# Patient Record
Sex: Female | Born: 1935 | Race: Black or African American | Hispanic: No | Marital: Single | Smoking: Never smoker
Health system: Southern US, Community
[De-identification: ages and names within clinical notes are randomized; demographics above are authoritative.]

## PROBLEM LIST (undated history)

## (undated) DIAGNOSIS — I10 Essential (primary) hypertension: Secondary | ICD-10-CM

## (undated) DIAGNOSIS — M1990 Unspecified osteoarthritis, unspecified site: Secondary | ICD-10-CM

## (undated) DIAGNOSIS — I509 Heart failure, unspecified: Secondary | ICD-10-CM

## (undated) DIAGNOSIS — E7800 Pure hypercholesterolemia, unspecified: Secondary | ICD-10-CM

## (undated) DIAGNOSIS — Z950 Presence of cardiac pacemaker: Secondary | ICD-10-CM

## (undated) HISTORY — PX: APPENDECTOMY: SHX54

## (undated) HISTORY — PX: ABDOMINAL HYSTERECTOMY: SHX81

## (undated) HISTORY — PX: CARDIAC SURGERY: SHX584

## (undated) HISTORY — PX: HIP FRACTURE SURGERY: SHX118

---

## 2020-01-08 ENCOUNTER — Other Ambulatory Visit (HOSPITAL_BASED_OUTPATIENT_CLINIC_OR_DEPARTMENT_OTHER): Payer: Self-pay

## 2020-05-12 ENCOUNTER — Encounter (HOSPITAL_BASED_OUTPATIENT_CLINIC_OR_DEPARTMENT_OTHER): Payer: Self-pay | Admitting: Emergency Medicine

## 2020-05-12 ENCOUNTER — Emergency Department (HOSPITAL_BASED_OUTPATIENT_CLINIC_OR_DEPARTMENT_OTHER): Payer: Medicare Other

## 2020-05-12 ENCOUNTER — Emergency Department (HOSPITAL_BASED_OUTPATIENT_CLINIC_OR_DEPARTMENT_OTHER)
Admission: EM | Admit: 2020-05-12 | Discharge: 2020-05-12 | Disposition: A | Discharge status: Home / Self Care | Payer: Medicare Other | Attending: Emergency Medicine | Admitting: Emergency Medicine

## 2020-05-12 ENCOUNTER — Other Ambulatory Visit: Payer: Self-pay

## 2020-05-12 DIAGNOSIS — T17200A Unspecified foreign body in pharynx causing asphyxiation, initial encounter: Secondary | ICD-10-CM | POA: Insufficient documentation

## 2020-05-12 DIAGNOSIS — Z7982 Long term (current) use of aspirin: Secondary | ICD-10-CM | POA: Insufficient documentation

## 2020-05-12 DIAGNOSIS — Y9289 Other specified places as the place of occurrence of the external cause: Secondary | ICD-10-CM | POA: Insufficient documentation

## 2020-05-12 DIAGNOSIS — Z79899 Other long term (current) drug therapy: Secondary | ICD-10-CM | POA: Diagnosis not present

## 2020-05-12 DIAGNOSIS — W458XXA Other foreign body or object entering through skin, initial encounter: Secondary | ICD-10-CM | POA: Insufficient documentation

## 2020-05-12 DIAGNOSIS — R05 Cough: Secondary | ICD-10-CM | POA: Diagnosis not present

## 2020-05-12 DIAGNOSIS — I509 Heart failure, unspecified: Secondary | ICD-10-CM | POA: Diagnosis not present

## 2020-05-12 DIAGNOSIS — I110 Hypertensive heart disease with heart failure: Secondary | ICD-10-CM | POA: Diagnosis not present

## 2020-05-12 DIAGNOSIS — R0989 Other specified symptoms and signs involving the circulatory and respiratory systems: Secondary | ICD-10-CM

## 2020-05-12 DIAGNOSIS — Y999 Unspecified external cause status: Secondary | ICD-10-CM | POA: Diagnosis not present

## 2020-05-12 DIAGNOSIS — Y9389 Activity, other specified: Secondary | ICD-10-CM | POA: Diagnosis not present

## 2020-05-12 HISTORY — DX: Presence of cardiac pacemaker: Z95.0

## 2020-05-12 HISTORY — DX: Heart failure, unspecified: I50.9

## 2020-05-12 HISTORY — DX: Essential (primary) hypertension: I10

## 2020-05-12 HISTORY — DX: Unspecified osteoarthritis, unspecified site: M19.90

## 2020-05-12 HISTORY — DX: Pure hypercholesterolemia, unspecified: E78.00

## 2020-05-12 MED ORDER — LIDOCAINE VISCOUS HCL 2 % MT SOLN
15.0000 mL | Freq: Once | OROMUCOSAL | Status: AC
  Administered 2020-05-12: 15 mL via OROMUCOSAL
  Filled 2020-05-12: qty 15

## 2020-05-12 NOTE — ED Provider Notes (Addendum)
[facility] EMERGENCY DEPARTMENT Provider Note   CSN: [id number] Arrival date & time: 05/12/20  1237     History Chief Complaint  Patient presents with  . Foreign Body    throat    Hartley Wyke is a 86 y.o. female.  86 yo F with a cc of possible pill aspiration.  The patient was taking her morning medications and she felt like a pill got stuck and she had a coughing fit for some time.  Still has a sensation that there is something stuck in there.  Has been able to eat and drink afterwards.  She is unsure which pill it was that she was taking.  Typically takes 2 at a time.  The history is provided by the patient.  Foreign Body Associated symptoms: cough and trouble swallowing   Associated symptoms: no congestion, no nausea, no rhinorrhea and no vomiting   Illness Severity:  Moderate Onset quality:  Gradual Duration:  2 hours Timing:  Rare Progression:  Resolved Chronicity:  New Associated symptoms: cough   Associated symptoms: no chest pain, no congestion, no fever, no headaches, no myalgias, no nausea, no rhinorrhea, no shortness of breath, no vomiting and no wheezing        Past Medical History:  Diagnosis Date  . Arthritis   . CHF (congestive heart failure) (HCC)   . High cholesterol   . Hypertension   . Pacemaker     There are no problems to display for this patient.   Past Surgical History:  Procedure Laterality Date  . ABDOMINAL HYSTERECTOMY    . APPENDECTOMY    . CARDIAC SURGERY       OB History   No obstetric history on file.     No family history on file.  Social History   Tobacco Use  . Smoking status: Never Smoker  . Smokeless tobacco: Never Used  Substance Use Topics  . Alcohol use: Not Currently  . Drug use: Not on file    Home Medications Prior to Admission medications   Medication Sig Start Date End Date Taking? Authorizing Provider  acetaminophen-codeine (TYLENOL #3) 300-30 MG tablet Take 1 tablet by mouth every 4  (four) hours as needed for moderate pain. Takes 2 times a day   Yes [provider]  aspirin 325 MG tablet Take 325 mg by mouth daily.   Yes [provider]  dextromethorphan-guaiFENesin (MUCINEX DM) 30-600 MG 12hr tablet Take 1 tablet by mouth 2 (two) times daily.   Yes [provider]  diltiazem (CARDIZEM) 120 MG tablet Take by mouth at bedtime. 03/26/20   [provider]  furosemide (LASIX) 40 MG tablet Take 40 mg by mouth daily. 03/31/20   [provider]  isosorbide mononitrate (ISMO) 20 MG tablet Take 20 mg by mouth 2 (two) times daily. 04/29/20   [provider]  potassium chloride SA (KLOR-CON) 20 MEQ tablet Take 20 mEq by mouth daily. 04/29/20   [provider]  rosuvastatin (CRESTOR) 10 MG tablet Take 10 mg by mouth daily. 04/29/20   [provider]    Allergies    Codeine  Review of Systems   Review of Systems  Constitutional: Negative for chills and fever.  HENT: Positive for trouble swallowing. Negative for congestion and rhinorrhea.   Eyes: Negative for redness and visual disturbance.  Respiratory: Positive for cough. Negative for shortness of breath and wheezing.   Cardiovascular: Negative for chest pain and palpitations.  Gastrointestinal: Negative for nausea and  vomiting.  Genitourinary: Negative for dysuria and urgency.  Musculoskeletal: Negative for arthralgias and myalgias.  Skin: Negative for pallor and wound.  Neurological: Negative for dizziness and headaches.    Physical Exam Updated Vital Signs BP (!) 140/56   Pulse (!) 58   Temp 98.2 F (36.8 C) (Oral)   Resp 16   Ht 4\' 11"  (1.499 m)   Wt 49 kg   SpO2 98%   BMI 21.81 kg/m   Physical Exam Vitals and nursing note reviewed.  Constitutional:      General: She is not in acute distress.    Appearance: She is well-developed. She is not diaphoretic.  HENT:     Head: Normocephalic and atraumatic.     Comments: No erythema to the posterior  oropharynx no tonsillar swelling.  Tolerating secretions out difficulty. Eyes:     Pupils: Pupils are equal, round, and reactive to light.  Cardiovascular:     Rate and Rhythm: Normal rate and regular rhythm.     Heart sounds: No murmur heard.  No friction rub. No gallop.   Pulmonary:     Effort: Pulmonary effort is normal.     Breath sounds: No wheezing or rales.  Abdominal:     General: There is no distension.     Palpations: Abdomen is soft.     Tenderness: There is no abdominal tenderness.  Musculoskeletal:        General: No tenderness.     Cervical back: Normal range of motion and neck supple.  Skin:    General: Skin is warm and dry.  Neurological:     Mental Status: She is alert and oriented to person, place, and time.  Psychiatric:        Behavior: Behavior normal.     ED Results / Procedures / Treatments   Labs (all labs ordered are listed, but only abnormal results are displayed) Labs Reviewed - No data to display  EKG None  Radiology DG [facility] 1 View  Result Date: 05/12/2020 CLINICAL DATA:  Pill aspiration.  Lingering midsternal chest pain EXAM: PORTABLE CHEST 1 VIEW COMPARISON:  None FINDINGS: LEFT-sided dual lead pacer device power pack over LEFT chest. Cardiac enlargement with tortuous and calcified thoracic aorta. Lungs are clear. LEFT chest partially obscured by power pack for pacer device. No unexpected radiopaque foreign body. No acute skeletal process on limited assessment. Marked glenohumeral degenerative changes on the RIGHT. IMPRESSION: 1. No acute cardiopulmonary disease. Electronically Signed   By: 07/12/2020 M.D.   On: 05/12/2020 14:35    Procedures Procedures (including critical care time)  Medications Ordered in ED Medications  lidocaine (XYLOCAINE) 2 % viscous mouth solution 15 mL (15 mLs Mouth/Throat Given 05/12/20 1437)    ED Course  I have reviewed the triage vital signs and the nursing notes.  Pertinent labs & imaging results  that were available during my care of the patient were reviewed by me and considered in my medical decision making (see chart for details).    MDM Rules/Calculators/A&P                          86 yo F with a chief complaints of a possible aspiration of the pill.  Able to swallow afterwards.  Will obtain a plain film of the chest.  Viscous lidocaine.  Able to swallow without difficulty.  Chest x-ray reviewed by me without focal infiltrate no aspiration.  Discharge home.  3:31 PM:  I have discussed the diagnosis/risks/treatment options with the patient and family and believe the pt to be eligible for discharge home to follow-up with PCP. We also discussed returning to the ED immediately if new or worsening sx occur. We discussed the sx which are most concerning (e.g., sudden worsening pain, fever, inability to tolerate by mouth) that necessitate immediate return. Medications administered to the patient during their visit and any new prescriptions provided to the patient are listed below.  Medications given during this visit Medications  lidocaine (XYLOCAINE) 2 % viscous mouth solution 15 mL (15 mLs Mouth/Throat Given 05/12/20 1437)     The patient appears reasonably screen and/or stabilized for discharge and I doubt any other medical condition or other [facility] requiring further screening, evaluation, or treatment in the ED at this time prior to discharge.   Final Clinical Impression(s) / ED Diagnoses Final diagnoses:  Foreign body sensation in throat    Rx / DC Orders ED Discharge Orders    None       Melene Plan, DO 05/12/20 1531    Melene Plan, DO 05/12/20 1531

## 2020-05-12 NOTE — ED Triage Notes (Signed)
States she was taking her medication this morning and she got choked on one of the pills. She still feels like it is stuck in her throat. No distress noted, pt able to handle secretions.

## 2020-05-12 NOTE — Discharge Instructions (Signed)
Please return for inability to swallow, worsening cough or trouble breathing

## 2020-05-12 NOTE — ED Notes (Signed)
ED Provider at bedside. 

## 2020-07-02 ENCOUNTER — Other Ambulatory Visit (HOSPITAL_BASED_OUTPATIENT_CLINIC_OR_DEPARTMENT_OTHER): Payer: Self-pay | Admitting: Family Medicine

## 2020-07-08 ENCOUNTER — Emergency Department (HOSPITAL_BASED_OUTPATIENT_CLINIC_OR_DEPARTMENT_OTHER): Payer: Medicare Other

## 2020-07-08 ENCOUNTER — Emergency Department (HOSPITAL_BASED_OUTPATIENT_CLINIC_OR_DEPARTMENT_OTHER)
Admission: EM | Admit: 2020-07-08 | Discharge: 2020-07-08 | Disposition: A | Discharge status: Home / Self Care | Payer: Medicare Other | Attending: Emergency Medicine | Admitting: Emergency Medicine

## 2020-07-08 ENCOUNTER — Encounter (HOSPITAL_BASED_OUTPATIENT_CLINIC_OR_DEPARTMENT_OTHER): Payer: Self-pay

## 2020-07-08 ENCOUNTER — Other Ambulatory Visit: Payer: Self-pay

## 2020-07-08 ENCOUNTER — Other Ambulatory Visit (HOSPITAL_BASED_OUTPATIENT_CLINIC_OR_DEPARTMENT_OTHER): Payer: Self-pay | Admitting: Emergency Medicine

## 2020-07-08 DIAGNOSIS — R053 Chronic cough: Secondary | ICD-10-CM

## 2020-07-08 DIAGNOSIS — Y92002 Bathroom of unspecified non-institutional (private) residence single-family (private) house as the place of occurrence of the external cause: Secondary | ICD-10-CM | POA: Insufficient documentation

## 2020-07-08 DIAGNOSIS — I110 Hypertensive heart disease with heart failure: Secondary | ICD-10-CM | POA: Diagnosis not present

## 2020-07-08 DIAGNOSIS — W19XXXA Unspecified fall, initial encounter: Secondary | ICD-10-CM

## 2020-07-08 DIAGNOSIS — Z7982 Long term (current) use of aspirin: Secondary | ICD-10-CM | POA: Diagnosis not present

## 2020-07-08 DIAGNOSIS — Z79899 Other long term (current) drug therapy: Secondary | ICD-10-CM | POA: Insufficient documentation

## 2020-07-08 DIAGNOSIS — M25551 Pain in right hip: Secondary | ICD-10-CM

## 2020-07-08 DIAGNOSIS — I509 Heart failure, unspecified: Secondary | ICD-10-CM | POA: Insufficient documentation

## 2020-07-08 DIAGNOSIS — Z950 Presence of cardiac pacemaker: Secondary | ICD-10-CM | POA: Insufficient documentation

## 2020-07-08 DIAGNOSIS — W1811XA Fall from or off toilet without subsequent striking against object, initial encounter: Secondary | ICD-10-CM | POA: Diagnosis not present

## 2020-07-08 MED ORDER — DOXYCYCLINE HYCLATE 100 MG PO CAPS: 100.0000 mg | ORAL_CAPSULE | Freq: Two times a day (BID) | ORAL | 0 refills | Status: DC

## 2020-07-08 MED FILL — DOXYCYCLINE HYCLATE 100 MG: 100 | 10 days supply | Qty: 20 | Fill #0

## 2020-07-08 MED FILL — OMEPRAZOLE 40 MG CPDR: 40 | 90 days supply | Qty: 90 | Fill #0

## 2020-07-08 NOTE — ED Notes (Signed)
Pt fell off commode earlier in week, per family only told anyone about it today, has been able to ambulate but with visible discomfort.  Pt changed into hospital gown for exam.  No visible bruising noted, strong pedal pulses bilat.

## 2020-07-08 NOTE — Discharge Instructions (Addendum)
You were seen in the emergency department for evaluation of right hip pain after a fall.  You also were having worsening of a chronic cough.  You had x-rays and a CAT scan which showed a lot of arthritis in your back but no obvious fractures.  There was also no obvious pneumonia.  Tylenol for pain weightbearing as tolerated.  We also starting you on a course of antibiotics for your cough.  Please schedule follow-up appointment with your primary care doctor.  Return to the emergency department if any worsening or concerning symptoms

## 2020-07-08 NOTE — ED Notes (Signed)
Patient transported to CT 

## 2020-07-08 NOTE — ED Provider Notes (Signed)
[facility] EMERGENCY DEPARTMENT Provider Note   CSN: [id number] Arrival date & time: 07/08/20  9628     History No chief complaint on file.   Mackayla Mullins is a 86 y.o. female.  She is brought in by her daughter for evaluation of right hip pain.  The patient states she fell about a week and a half ago off of the commode but did not tell anybody about her symptoms until today.  Pain is worse with movement of the leg and walking.  No numbness.  Denies any back pain.  Sometimes the pain runs down the leg though.  Daughter states she has had a chronic cough and is seeing the pulmonologist but is still coughing.  They cannot seem to find a cause for it.  No fevers denies shortness of breath.  No chest pain.  Has been Covid vaccinated.  No head or neck pain.  The history is provided by the patient and a relative.  Hip Pain This is a new problem. The current episode started more than 1 week ago. The problem occurs constantly. The problem has not changed since onset.Pertinent negatives include no chest pain, no abdominal pain, no headaches and no shortness of breath. The symptoms are aggravated by walking and bending. Nothing relieves the symptoms. She has tried rest for the symptoms. The treatment provided no relief.       Past Medical History:  Diagnosis Date  . Arthritis   . CHF (congestive heart failure) (HCC)   . High cholesterol   . Hypertension   . Pacemaker     There are no problems to display for this patient.   Past Surgical History:  Procedure Laterality Date  . ABDOMINAL HYSTERECTOMY    . APPENDECTOMY    . CARDIAC SURGERY       OB History   No obstetric history on file.     History reviewed. No pertinent family history.  Social History   Tobacco Use  . Smoking status: Never Smoker  . Smokeless tobacco: Never Used  Substance Use Topics  . Alcohol use: Not Currently  . Drug use: Not on file    Home Medications Prior to Admission medications     Medication Sig Start Date End Date Taking? Authorizing Provider  acetaminophen-codeine (TYLENOL #3) 300-30 MG tablet Take 1 tablet by mouth every 4 (four) hours as needed for moderate pain. Takes 2 times a day    [provider]  aspirin 325 MG tablet Take 325 mg by mouth daily.    [provider]  dextromethorphan-guaiFENesin (MUCINEX DM) 30-600 MG 12hr tablet Take 1 tablet by mouth 2 (two) times daily.    [provider]  diltiazem (CARDIZEM) 120 MG tablet Take by mouth at bedtime. 03/26/20   [provider]  furosemide (LASIX) 40 MG tablet Take 40 mg by mouth daily. 03/31/20   [provider]  isosorbide mononitrate (ISMO) 20 MG tablet Take 20 mg by mouth 2 (two) times daily. 04/29/20   [provider]  potassium chloride SA (KLOR-CON) 20 MEQ tablet Take 20 mEq by mouth daily. 04/29/20   [provider]  rosuvastatin (CRESTOR) 10 MG tablet Take 10 mg by mouth daily. 04/29/20   [provider]    Allergies    Codeine  Review of Systems   Review of Systems  Constitutional: Negative for fever.  HENT: Negative for sore throat.   Eyes: Negative for pain.  Respiratory: Positive for cough. Negative for shortness of  breath.   Cardiovascular: Negative for chest pain.  Gastrointestinal: Negative for abdominal pain.  Genitourinary: Negative for dysuria.  Musculoskeletal: Positive for gait problem. Negative for back pain.  Skin: Negative for rash.  Neurological: Negative for headaches.    Physical Exam Updated Vital Signs BP (!) 152/61 (BP Location: Right Arm)   Pulse 61   Temp 98.6 F (37 C) (Oral)   Resp 16   Ht 4\' 11"  (1.499 m)   Wt 50.8 kg   SpO2 98%   BMI 22.62 kg/m   Physical Exam Vitals and nursing note reviewed.  Constitutional:      General: She is not in acute distress.    Appearance: Normal appearance. She is well-developed.  HENT:     Head: Normocephalic and atraumatic.  Eyes:      Conjunctiva/sclera: Conjunctivae normal.  Cardiovascular:     Rate and Rhythm: Normal rate and regular rhythm.     Heart sounds: No murmur heard.   Pulmonary:     Effort: Pulmonary effort is normal. No respiratory distress.     Breath sounds: Normal breath sounds.  Abdominal:     Palpations: Abdomen is soft.     Tenderness: There is no abdominal tenderness.  Musculoskeletal:        General: Tenderness present.     Cervical back: Neck supple.     Comments: She has diffuse tenderness around her right hip and buttock.  There is no obvious shortening or rotation.  Knee and ankle nontender.  Other extremities full range of motion without any pain or limitations.  Skin:    General: Skin is warm and dry.     Capillary Refill: Capillary refill takes less than 2 seconds.  Neurological:     General: No focal deficit present.     Mental Status: She is alert.     Sensory: No sensory deficit.     Motor: No weakness.     ED Results / Procedures / Treatments   Labs (all labs ordered are listed, but only abnormal results are displayed) Labs Reviewed - No data to display  EKG None  Radiology DG Chest 2 View  Result Date: 07/08/2020 CLINICAL DATA:  Right hip pain fall. EXAM: CHEST - 2 VIEW COMPARISON:  One-view chest x-ray 05/12/2020 FINDINGS: Heart size is mildly enlarged. Atherosclerotic changes are present in the aorta. No edema or effusion is present. Pacing wires are in place. Advanced degenerative changes are present shoulder. Scoliosis is evident. IMPRESSION: 1. Mild cardiomegaly without failure. 2. No acute cardiopulmonary disease. 3. Atherosclerosis. Electronically Signed   By: 07/12/2020 M.D.   On: 07/08/2020 10:39   DG Lumbar Spine Complete  Result Date: 07/08/2020 CLINICAL DATA:  Back pain after fall EXAM: LUMBAR SPINE - COMPLETE 4+ VIEW COMPARISON:  None. FINDINGS: Diffuse osseous demineralization. There is lumbar dextrocurvature centered at L3-4 with chronic appearing  asymmetric wedging of the L3 and L4 vertebral bodies. Severe multilevel intervertebral disc height loss. Straightening of the lumbar lordosis without static listhesis. Advanced abdominal aortic atherosclerosis. IMPRESSION: 1. Diffuse osseous demineralization with chronic appearing asymmetric wedging of the L3 and L4 vertebral bodies likely compensatory to lumbar dextrocurvature. Difficult to exclude acute on chronic fracture. 2. Severe multilevel degenerative disc disease. Electronically Signed   By: 13/03/2020 D.O.   On: 07/08/2020 10:43   CT Lumbar Spine Wo Contrast  Result Date: 07/08/2020 CLINICAL DATA:  13/03/2020. Back pain. EXAM: CT LUMBAR SPINE WITHOUT CONTRAST TECHNIQUE: Multidetector CT imaging of the lumbar spine  was performed without intravenous contrast administration. Multiplanar CT image reconstructions were also generated. COMPARISON:  None. FINDINGS: Segmentation: There are five lumbar type vertebral bodies. The last full intervertebral disc space is labeled L5-S1. Alignment: Severe scoliosis and associated advanced degenerative lumbar spondylosis with multilevel disc disease and facet disease. Vertebrae: No acute lumbar spine fracture is identified. The facets are maintained. No pars defects or fractures. No upper sacral fractures are identified. Paraspinal and other soft tissues: No significant paraspinal or retroperitoneal findings other than severe/advanced atherosclerotic calcifications involving the aorta and branch vessels. No retroperitoneal hematoma. Disc levels: Advanced degenerative lumbar spondylosis with significant multilevel disc disease and facet disease. Multilevel spinal and foraminal stenosis. IMPRESSION: 1. Severe scoliosis and associated advanced degenerative lumbar spondylosis with multilevel disc disease and facet disease. 2. No acute lumbar spine fracture. 3. Multilevel spinal and foraminal stenosis. 4. Aortic atherosclerosis. Aortic Atherosclerosis (ICD10-I70.0).  Electronically Signed   By: Rudie MeyerP.  Gallerani M.D.   On: 07/08/2020 12:32   CT Hip Right Wo Contrast  Result Date: 07/08/2020 CLINICAL DATA:  Hip pain, recent fall EXAM: CT OF THE RIGHT HIP WITHOUT CONTRAST TECHNIQUE: Multidetector CT imaging of the right hip was performed according to the standard protocol. Multiplanar CT image reconstructions were also generated. COMPARISON:  X-ray 07/08/2020 FINDINGS: Bones/Joint/Cartilage No acute fracture or dislocation of the right hip. Mild hip joint space narrowing. No appreciable hip joint effusion. Visualized portion of the right hemipelvis is intact. Pubic symphysis and visualized right SI joint intact without diastasis. No lytic or sclerotic bone lesions are identified. The bones are demineralized. Ligaments Suboptimally assessed by CT. Muscles and Tendons No acute musculotendinous abnormality by CT. Soft tissues No soft tissue swelling or focal hematoma. Atherosclerotic calcifications are present. IMPRESSION: No acute osseous abnormality of the right hip. Electronically Signed   By: Duanne GuessNicholas  Plundo D.O.   On: 07/08/2020 12:26   DG Hip Unilat With Pelvis 2-3 Views Right  Result Date: 07/08/2020 CLINICAL DATA:  Larey SeatFell. Right hip pain. EXAM: DG HIP (WITH OR WITHOUT PELVIS) 2-3V RIGHT COMPARISON:  None. FINDINGS: Both hips are normally located. Mild bilateral hip joint degenerative changes for age. No acute hip fracture is identified. The pubic symphysis and SI joints are intact. No pelvic fractures or bone lesions. Age related vascular calcifications. Degenerative changes noted in the lower lumbar spine. IMPRESSION: Mild degenerative changes but no acute bony findings. Electronically Signed   By: Rudie MeyerP.  Gallerani M.D.   On: 07/08/2020 10:43    Procedures Procedures (including critical care time)  Medications Ordered in ED Medications - No data to display  ED Course  I have reviewed the triage vital signs and the nursing notes.  Pertinent labs & imaging results  that were available during my care of the patient were reviewed by me and considered in my medical decision making (see chart for details).  Clinical Course as of Jul 08 1725  Mon Jul 08, 2020  1239 Reviewed results of imaging with patient and her daughter.  Daughter is asking if we can put her on some antibiotics for her cough.  Will treat empirically for bronchitis.   [MB]    Clinical Course User Index [MB] Terrilee FilesButler, Arriona Prest C, MD   MDM Rules/Calculators/A&P                         86 year old female brought in by her daughter for evaluation of injuries from a fall over a week ago.  Primarily tender around her left  hip and buttock.  Ordered and interpreted imaging of plain films of lumbosacral spine hip and chest as degenerative changes scoliosis no acute infiltrates.  Due to her continued hip pain I ordered CAT scans of her lumbar spine and right hip which do not show any other occult fractures but significant degenerative changes.  I reviewed this with the patient and her daughter.  She is comfortable with plan for mild pain control and following up with her doctor.  She does ask if we can put her on antibiotics for her cough which I have done.  Return instructions discussed  Final Clinical Impression(s) / ED Diagnoses Final diagnoses:  Fall, initial encounter  Right hip pain  Chronic cough    Rx / DC Orders ED Discharge Orders         Ordered    doxycycline (VIBRAMYCIN) 100 MG capsule  2 times daily        07/08/20 1240           Terrilee Files, MD 07/08/20 1728

## 2020-07-08 NOTE — ED Triage Notes (Signed)
Per pt fell from commode, I have R hip/leg/abd pain since fall.

## 2020-08-09 ENCOUNTER — Ambulatory Visit: Payer: Medicare Other | Attending: Internal Medicine

## 2020-08-09 ENCOUNTER — Other Ambulatory Visit (HOSPITAL_BASED_OUTPATIENT_CLINIC_OR_DEPARTMENT_OTHER): Payer: Self-pay | Admitting: Internal Medicine

## 2020-08-09 DIAGNOSIS — Z23 Encounter for immunization: Secondary | ICD-10-CM

## 2020-08-09 NOTE — Progress Notes (Signed)
   Covid-19 Vaccination Clinic  Name:  Cassandra Patel    MRN: [id number] DOB: March 06, 1922  08/09/2020  [name] was observed post Covid-19 immunization for 15 minutes without incident. She was provided with Vaccine Information Sheet and instruction to access the V-Safe system.   [name] was instructed to call 911 with any severe reactions post vaccine: Marland Kitchen Difficulty breathing  . Swelling of face and throat  . A fast heartbeat  . A bad rash all over body  . Dizziness and weakness   Immunizations Administered    Name Date Dose VIS Date Route   Pfizer COVID-19 Vaccine 08/09/2020 10:37 AM 0.3 mL 06/19/2020 Intramuscular   Manufacturer: [employer], [employer]   Lot: [id number]   NDC: [address]-2

## 2020-08-13 MED FILL — PFIZER-BIONTECH COVID-19 VA: 30 | 1 days supply | Qty: 0 | Fill #0

## 2020-08-30 ENCOUNTER — Emergency Department (HOSPITAL_BASED_OUTPATIENT_CLINIC_OR_DEPARTMENT_OTHER)
Admission: EM | Admit: 2020-08-30 | Discharge: 2020-08-30 | Disposition: A | Discharge status: Home / Self Care | Payer: Medicare Other | Attending: Emergency Medicine | Admitting: Emergency Medicine

## 2020-08-30 ENCOUNTER — Other Ambulatory Visit (HOSPITAL_BASED_OUTPATIENT_CLINIC_OR_DEPARTMENT_OTHER): Payer: Self-pay | Admitting: Emergency Medicine

## 2020-08-30 ENCOUNTER — Encounter (HOSPITAL_BASED_OUTPATIENT_CLINIC_OR_DEPARTMENT_OTHER): Payer: Self-pay | Admitting: *Deleted

## 2020-08-30 ENCOUNTER — Other Ambulatory Visit: Payer: Self-pay

## 2020-08-30 ENCOUNTER — Emergency Department (HOSPITAL_BASED_OUTPATIENT_CLINIC_OR_DEPARTMENT_OTHER): Payer: Medicare Other

## 2020-08-30 DIAGNOSIS — Z79899 Other long term (current) drug therapy: Secondary | ICD-10-CM | POA: Diagnosis not present

## 2020-08-30 DIAGNOSIS — I509 Heart failure, unspecified: Secondary | ICD-10-CM | POA: Diagnosis not present

## 2020-08-30 DIAGNOSIS — I110 Hypertensive heart disease with heart failure: Secondary | ICD-10-CM | POA: Diagnosis not present

## 2020-08-30 DIAGNOSIS — Z20822 Contact with and (suspected) exposure to covid-19: Secondary | ICD-10-CM | POA: Diagnosis not present

## 2020-08-30 DIAGNOSIS — R059 Cough, unspecified: Secondary | ICD-10-CM | POA: Insufficient documentation

## 2020-08-30 DIAGNOSIS — Z950 Presence of cardiac pacemaker: Secondary | ICD-10-CM | POA: Diagnosis not present

## 2020-08-30 DIAGNOSIS — R0989 Other specified symptoms and signs involving the circulatory and respiratory systems: Secondary | ICD-10-CM | POA: Insufficient documentation

## 2020-08-30 DIAGNOSIS — Z7982 Long term (current) use of aspirin: Secondary | ICD-10-CM | POA: Insufficient documentation

## 2020-08-30 LAB — RESP PANEL BY RT-PCR (FLU A&B, COVID) ARPGX2
Influenza A by PCR: NEGATIVE
Influenza B by PCR: NEGATIVE
SARS Coronavirus 2 by RT PCR: NEGATIVE

## 2020-08-30 MED ORDER — CEFTRIAXONE SODIUM 1 G IJ SOLR
1.0000 g | Freq: Once | INTRAMUSCULAR | Status: AC
  Administered 2020-08-30: 1 g via INTRAMUSCULAR
  Filled 2020-08-30: qty 10

## 2020-08-30 MED ORDER — DOXYCYCLINE HYCLATE 100 MG PO CAPS: 100.0000 mg | ORAL_CAPSULE | Freq: Two times a day (BID) | ORAL | 0 refills | Status: DC

## 2020-08-30 MED FILL — DOXYCYCLINE HYCLATE 100 MG: 100 | 7 days supply | Qty: 14 | Fill #0

## 2020-08-30 NOTE — ED Provider Notes (Signed)
[facility] EMERGENCY DEPARTMENT Provider Note   CSN: [id number] Arrival date & time: 08/30/20  3329     History Chief Complaint  Patient presents with  . Cough    Rhona Fusilier is a 86 y.o. female.  Patient presents to ER chief complaint of cough and congestion.  He was recently on a cruise and returned home 5 days ago.  She was tested before and during the cruise and was Covid negative.  However she is had symptoms now of cough and congestion for 2 days since returning.  She tried to see her primary care doctor but was told to get a Covid test before she was able to see her doctor.  Otherwise denies any fevers or pain.  Denies shortness of breath.  Denies vomiting or diarrhea or loss of smell or taste.        Past Medical History:  Diagnosis Date  . Arthritis   . CHF (congestive heart failure) (HCC)   . High cholesterol   . Hypertension   . Pacemaker     There are no problems to display for this patient.   Past Surgical History:  Procedure Laterality Date  . ABDOMINAL HYSTERECTOMY    . APPENDECTOMY    . CARDIAC SURGERY       OB History   No obstetric history on file.     History reviewed. No pertinent family history.  Social History   Tobacco Use  . Smoking status: Never Smoker  . Smokeless tobacco: Never Used  Substance Use Topics  . Alcohol use: Not Currently    Home Medications Prior to Admission medications   Medication Sig Start Date End Date Taking? Authorizing Provider  acetaminophen-codeine (TYLENOL #3) 300-30 MG tablet Take 1 tablet by mouth every 4 (four) hours as needed for moderate pain. Takes 2 times a day   Yes [provider]  aspirin 325 MG tablet Take 325 mg by mouth daily.   Yes [provider]  dextromethorphan-guaiFENesin (MUCINEX DM) 30-600 MG 12hr tablet Take 1 tablet by mouth 2 (two) times daily.   Yes [provider]  diltiazem (CARDIZEM) 120 MG tablet Take by mouth at bedtime. 03/26/20  Yes  [provider]  furosemide (LASIX) 40 MG tablet Take 40 mg by mouth daily. 03/31/20  Yes [provider]  isosorbide mononitrate (ISMO) 20 MG tablet Take 20 mg by mouth 2 (two) times daily. 04/29/20  Yes [provider]  potassium chloride SA (KLOR-CON) 20 MEQ tablet Take 20 mEq by mouth daily. 04/29/20  Yes [provider]  rosuvastatin (CRESTOR) 10 MG tablet Take 10 mg by mouth daily. 04/29/20  Yes [provider]  doxycycline (VIBRAMYCIN) 100 MG capsule Take 1 capsule (100 mg total) by mouth 2 (two) times daily. 07/08/20   Terrilee Files, MD    Allergies    Codeine  Review of Systems   Review of Systems  Constitutional: Negative for fever.  HENT: Negative for ear pain.   Eyes: Negative for pain.  Respiratory: Positive for cough.   Cardiovascular: Negative for chest pain.  Gastrointestinal: Negative for abdominal pain.  Genitourinary: Negative for flank pain.  Musculoskeletal: Negative for back pain.  Skin: Negative for rash.  Neurological: Negative for headaches.    Physical Exam Updated Vital Signs BP (!) 155/51 (BP Location: Right Arm)   Pulse 68   Temp 98.5 F (36.9 C) (Oral)   Resp 16   Ht 4\' 11"  (1.499 m)   Wt 48.1  kg   SpO2 98%   BMI 21.41 kg/m   Physical Exam Constitutional:      General: She is not in acute distress.    Appearance: Normal appearance.  HENT:     Head: Normocephalic.     Nose: Nose normal.  Eyes:     Extraocular Movements: Extraocular movements intact.  Cardiovascular:     Rate and Rhythm: Normal rate.  Pulmonary:     Effort: Pulmonary effort is normal.  Musculoskeletal:        General: Normal range of motion.     Cervical back: Normal range of motion.  Neurological:     General: No focal deficit present.     Mental Status: She is alert. Mental status is at baseline.     ED Results / Procedures / Treatments   Labs (all labs ordered are listed, but only abnormal results are  displayed) Labs Reviewed  RESP PANEL BY RT-PCR (FLU A&B, COVID) [id number]    EKG None  Radiology DG Chest Port 1 View  Result Date: 08/30/2020 CLINICAL DATA:  Cough EXAM: PORTABLE CHEST 1 VIEW COMPARISON:  07/08/2020 FINDINGS: Left pacer remains in place, unchanged. Heart is normal size. Right infrahilar and left suprahilar airspace opacities could reflect pneumonia. No effusions or acute bony abnormality. IMPRESSION: Patchy right infrahilar and left suprahilar opacities concerning for pneumonia. Electronically Signed   By: Charlett Nose M.D.   On: 08/30/2020 13:42    Procedures Procedures (including critical care time)  Medications Ordered in ED Medications  cefTRIAXone (ROCEPHIN) injection 1 g (has no administration in time range)    ED Course  I have reviewed the triage vital signs and the nursing notes.  Pertinent labs & imaging results that were available during my care of the patient were reviewed by me and considered in my medical decision making (see chart for details).    MDM Rules/Calculators/A&P                          X-ray shows hazy opacities concerning for possible pneumonia per radiology.  Covid test sent and is negative.  Patient given Rocephin here in the ER, given prescription of doxycycline to go home with.  Advised follow-up with her primary care doctor in 2 or 3 days.  Advised immediate return for difficulty breathing fevers worsening cough or any additional concerns.   Final Clinical Impression(s) / ED Diagnoses Final diagnoses:  Cough    Rx / DC Orders ED Discharge Orders    None       Cheryll Cockayne, MD 08/30/20 1610

## 2020-08-30 NOTE — ED Triage Notes (Signed)
Pt reports increase cough since taking a cruise 2 weeks ago, denies fevers, sore throat, or any other c/o. Pt daughter reports pt has had cough "for years" and her pulmonologist tells them her lungs "are fine". Pt is a/a in nad.

## 2020-08-30 NOTE — ED Notes (Signed)
Pt her with complaints of chronic cough but got worse yesterday. Had been on a cruise 5 days ago.  No SOB, no Diarrhea or vomiting

## 2020-08-30 NOTE — Discharge Instructions (Addendum)
Call your primary care doctor or specialist as discussed in the next 2-3 days.   Return immediately back to the ER if:  Your symptoms worsen within the next 12-24 hours. You develop new symptoms such as new fevers, persistent vomiting, new pain, shortness of breath, or new weakness or numbness, or if you have any other concerns.  

## 2020-08-30 NOTE — ED Notes (Signed)
Called for in lobby by [employer], no answer

## 2020-09-04 ENCOUNTER — Emergency Department (HOSPITAL_COMMUNITY): Payer: Medicare Other

## 2020-09-04 ENCOUNTER — Encounter (HOSPITAL_COMMUNITY): Payer: Self-pay | Admitting: Emergency Medicine

## 2020-09-04 ENCOUNTER — Emergency Department (HOSPITAL_COMMUNITY)
Admission: EM | Admit: 2020-09-04 | Discharge: 2020-09-05 | Disposition: A | Discharge status: Home / Self Care | Payer: Medicare Other | Attending: Emergency Medicine | Admitting: Emergency Medicine

## 2020-09-04 DIAGNOSIS — I509 Heart failure, unspecified: Secondary | ICD-10-CM | POA: Diagnosis not present

## 2020-09-04 DIAGNOSIS — M25511 Pain in right shoulder: Secondary | ICD-10-CM | POA: Insufficient documentation

## 2020-09-04 DIAGNOSIS — I110 Hypertensive heart disease with heart failure: Secondary | ICD-10-CM | POA: Insufficient documentation

## 2020-09-04 DIAGNOSIS — M25551 Pain in right hip: Secondary | ICD-10-CM | POA: Diagnosis not present

## 2020-09-04 DIAGNOSIS — Z950 Presence of cardiac pacemaker: Secondary | ICD-10-CM | POA: Diagnosis not present

## 2020-09-04 DIAGNOSIS — Y92003 Bedroom of unspecified non-institutional (private) residence as the place of occurrence of the external cause: Secondary | ICD-10-CM | POA: Diagnosis not present

## 2020-09-04 DIAGNOSIS — W06XXXA Fall from bed, initial encounter: Secondary | ICD-10-CM | POA: Insufficient documentation

## 2020-09-04 DIAGNOSIS — Z79899 Other long term (current) drug therapy: Secondary | ICD-10-CM | POA: Diagnosis not present

## 2020-09-04 DIAGNOSIS — Z7982 Long term (current) use of aspirin: Secondary | ICD-10-CM | POA: Insufficient documentation

## 2020-09-04 NOTE — ED Triage Notes (Signed)
Pt arrives via [facility] to ED from home after having fall on Monday- no blood thinner. Denies hit her head. Pt has pain in right hip and right shoulder.

## 2020-09-05 ENCOUNTER — Emergency Department (HOSPITAL_COMMUNITY): Payer: Medicare Other

## 2020-09-05 ENCOUNTER — Other Ambulatory Visit: Payer: Self-pay

## 2020-09-05 DIAGNOSIS — M25551 Pain in right hip: Secondary | ICD-10-CM | POA: Diagnosis not present

## 2020-09-05 NOTE — Discharge Instructions (Addendum)
You were seen today for hip and shoulder pain after the fall.  Take Tylenol for discomfort.  You may apply ice to the hip.  You likely sustained some bruising to the hip area.  Follow-up with your primary doctor.  You may benefit from physical therapy.

## 2020-09-05 NOTE — ED Notes (Signed)
Pt ambulated in room with walker. Pt c/o a little weakness but said she will do strengthening exercises.

## 2020-09-05 NOTE — ED Provider Notes (Signed)
[facility] Provider Note   CSN: [id number] Arrival date & time: 09/04/20  1248     History Chief Complaint  Patient presents with  . Fall         Cassandra Patel is a 87 y.o. female.  HPI     This is a 87 year old female with history of arthritis, CHF, hypertension who presents with right hip and shoulder pain.  Patient reports that she got up from her bed on Monday morning.  She made an abrupt turn and fell hitting her right shoulder and right hip on the floor.  She did not hit her head or lose consciousness.  She is not on any anticoagulants.  She reports persistent right hip pain with certain movements and with ambulation.  She reports that she has been using her scooter at home to help maneuver.  She states her hip hurts worse than her shoulder.  She denies any numbness or tingling of the foot or hand.  Patient denies any recent upper respiratory symptoms.  She does state that she recently got back from a cruise and has had "four COVID shots."  Past Medical History:  Diagnosis Date  . Arthritis   . CHF (congestive heart failure) ([place])   . High cholesterol   . Hypertension   . Pacemaker     There are no problems to display for this patient.   Past Surgical History:  Procedure Laterality Date  . ABDOMINAL HYSTERECTOMY    . APPENDECTOMY    . CARDIAC SURGERY       OB History   No obstetric history on file.     No family history on file.  Social History   Tobacco Use  . Smoking status: Never Smoker  . Smokeless tobacco: Never Used  Substance Use Topics  . Alcohol use: Not Currently    Home Medications Prior to Admission medications   Medication Sig Start Date End Date Taking? Authorizing Provider  acetaminophen-codeine (TYLENOL #3) 300-30 MG tablet Take 1 tablet by mouth every 4 (four) hours as needed for moderate pain. Takes 2 times a day    [provider]  aspirin 325 MG tablet Take 325 mg by mouth daily.     [provider]  dextromethorphan-guaiFENesin (MUCINEX DM) 30-600 MG 12hr tablet Take 1 tablet by mouth 2 (two) times daily.    [provider]  diltiazem (CARDIZEM) 120 MG tablet Take by mouth at bedtime. 03/26/20   [provider]  doxycycline (VIBRAMYCIN) 100 MG capsule Take 1 capsule (100 mg total) by mouth 2 (two) times daily for 7 days. 08/30/20 09/06/20  Luna Fuse, MD  furosemide (LASIX) 40 MG tablet Take 40 mg by mouth daily. 03/31/20   [provider]  isosorbide mononitrate (ISMO) 20 MG tablet Take 20 mg by mouth 2 (two) times daily. 04/29/20   [provider]  potassium chloride SA (KLOR-CON) 20 MEQ tablet Take 20 mEq by mouth daily. 04/29/20   [provider]  rosuvastatin (CRESTOR) 10 MG tablet Take 10 mg by mouth daily. 04/29/20   [provider]    Allergies    Codeine  Review of Systems   Review of Systems  Constitutional: Negative for fever.  Respiratory: Negative for shortness of breath.   Cardiovascular: Negative for chest pain.  Gastrointestinal: Negative for abdominal pain, nausea and vomiting.  Musculoskeletal:       Right hip pain, right shoulder pain  Neurological: Negative for weakness and numbness.  All other systems reviewed and are negative.   Physical Exam Updated Vital Signs BP (!) 166/53 (BP Location: Right Arm)   Pulse 66   Temp 97.9 F (36.6 C) (Oral)   Resp 18   Ht 1.499 m (4\' 11" )   Wt 48 kg   SpO2 100%   BMI 21.37 kg/m   Physical Exam Vitals and nursing note reviewed.  Constitutional:      Appearance: She is well-developed and well-nourished. She is not ill-appearing.  HENT:     Head: Normocephalic and atraumatic.     Nose: Nose normal.     Mouth/Throat:     Mouth: Mucous membranes are moist.  Eyes:     Pupils: Pupils are equal, round, and reactive to light.  Cardiovascular:     Rate and Rhythm: Normal rate and regular rhythm.     Heart sounds: Normal heart sounds.   Pulmonary:     Effort: Pulmonary effort is normal. No respiratory distress.     Breath sounds: No wheezing.  Abdominal:     General: Bowel sounds are normal.     Palpations: Abdomen is soft.     Tenderness: There is no abdominal tenderness. There is no guarding or rebound.  Musculoskeletal:     Cervical back: Neck supple.     Comments: Tenderness to palpation right AC joint, normal range of motion of the right shoulder, normal range of motion the right hip, no obvious foreshortening or deformity, there is tenderness to palpation over the lateral greater trochanter  Skin:    General: Skin is warm and dry.  Neurological:     Mental Status: She is alert and oriented to person, place, and time.  Psychiatric:        Mood and Affect: Mood and affect and mood normal.     ED Results / Procedures / Treatments   Labs (all labs ordered are listed, but only abnormal results are displayed) Labs Reviewed - No data to display  EKG None  Radiology DG Shoulder Right  Result Date: 09/04/2020 CLINICAL DATA:  Pain following fall EXAM: RIGHT SHOULDER - 2+ VIEW COMPARISON:  None. FINDINGS: Oblique, Y scapular, and axillary images were obtained. Bones are osteoporotic. There is no acute fracture or dislocation. There is generalized joint space narrowing with bony overgrowth along the inferior glenoid as well as bony overgrowth along the acromioclavicular joint, particularly inferiorly. No erosive change. Visualized right lung clear. IMPRESSION: Osteoporosis. Generalized osteoarthritic change with moderately severe joint space narrowing of the glenohumeral and acromioclavicular joints. Bony overgrowth along the inferior right acromioclavicular joint places patient at increased risk for development of impingement syndrome. No acute fracture or dislocation. No erosion. Electronically Signed   By: 11/02/2020 III M.D.   On: 09/04/2020 14:23   CT PELVIS WO CONTRAST  Result Date: 09/05/2020 CLINICAL DATA:   Pelvic trauma with right hip pain related to fall 2 days ago. EXAM: CT PELVIS WITHOUT CONTRAST TECHNIQUE: Multidetector CT imaging of the pelvis was performed following the standard protocol without intravenous contrast. COMPARISON:  None. FINDINGS: Urinary Tract:  Unremarkable Bowel:  Sigmoid: Diverticulosis. Vascular/Lymphatic: Heavily calcified arteries with bulky plaque at the left common femoral artery which could be occluded. Reproductive:  Hysterectomy Other:  Negative Musculoskeletal: No hip fracture or dislocation is seen. No evidence of pelvic ring fracture or diastasis. Severe disc degeneration at the covered lumbar levels of L3-4 and below. There is also degenerative facet spurring and scoliosis. There is canal and foraminal impingement at L3-4  and below. IMPRESSION: 1. No acute finding. 2. Advanced lower lumbar degenerative disease. Electronically Signed   By: Marnee Spring M.D.   On: 09/05/2020 04:39   DG Hip Unilat  With Pelvis 2-3 Views Right  Result Date: 09/04/2020 CLINICAL DATA:  Fall.  Right hip pain. EXAM: DG HIP (WITH OR WITHOUT PELVIS) 2-3V RIGHT COMPARISON:  CT 07/08/2020. FINDINGS: Diffuse osteopenia. Degenerative changes lumbar spine and both hips. No acute bony or joint abnormality. Peripheral vascular calcification. Pelvic calcifications consistent phleboliths. IMPRESSION: 1. Diffuse osteopenia. Degenerative changes lumbar spine and both hips. No acute abnormality identified. 2.  Peripheral vascular disease. Electronically Signed   By: Maisie Fus  Register   On: 09/04/2020 13:21    Procedures Procedures (including critical care time)  Medications Ordered in ED Medications - No data to display  ED Course  I have reviewed the triage vital signs and the nursing notes.  Pertinent labs & imaging results that were available during my care of the patient were reviewed by me and considered in my medical decision making (see chart for details).  Clinical Course as of 09/05/20 0628   Thu Sep 05, 2020  7106 Patient ambulated with a walker.  Reported some pain but states that her gait is at baseline. [CH]    Clinical Course User Index [CH] Suraya Vidrine, Mayer Masker, MD   MDM Rules/Calculators/A&P                          Patient presents with right hip and shoulder pain.  Fell greater than 48 hours ago.  She has been using a scooter at home.  She is overall nontoxic and vital signs are reassuring.  She has no external signs of trauma including deformity.  She does have some tenderness to palpation over the right greater trochanter and the right AC joint.  X-rays reviewed.  Patient has severe osteopenia.  However, no obvious fracture of the shoulder or dislocation.  Regarding her hip, there is no obvious fracture.  CT scan was obtained given her significant osteoporosis.  CT does not show any occult fracture.  Patient was able to ambulate and bear weight with a walker.  She reports that she uses a walker at home frequently.  I suspect she may have bruised her hip.  Have lower suspicion for occult fracture given that she can bear weight and ambulate at baseline with her walker.  Recommend Tylenol for pain.  Ice to the hip.  She may benefit from outpatient PT.  After history, exam, and medical workup I feel the patient has been appropriately medically screened and is safe for discharge home. Pertinent diagnoses were discussed with the patient. Patient was given return precautions.  Final Clinical Impression(s) / ED Diagnoses Final diagnoses:  Right hip pain  Acute pain of right shoulder    Rx / DC Orders ED Discharge Orders    None       Shon Baton, MD 09/05/20 [phone]

## 2020-09-09 MED FILL — ISOSORBIDE MN 20 MG TABLET: 20 | 30 days supply | Qty: 60 | Fill #0

## 2020-09-13 ENCOUNTER — Other Ambulatory Visit (HOSPITAL_BASED_OUTPATIENT_CLINIC_OR_DEPARTMENT_OTHER): Payer: Self-pay | Admitting: Family Medicine

## 2020-09-13 MED FILL — predniSONE 20 MG TABS: 20 | 5 days supply | Qty: 10 | Fill #0

## 2020-09-19 ENCOUNTER — Other Ambulatory Visit (HOSPITAL_BASED_OUTPATIENT_CLINIC_OR_DEPARTMENT_OTHER): Payer: Self-pay | Admitting: Internal Medicine

## 2020-09-19 MED FILL — levoFLOXacin 750 MG TABS: 750 | 7 days supply | Qty: 7 | Fill #0

## 2020-09-19 MED FILL — OMEPRAZOLE 40 MG CPDR: 40 | 90 days supply | Qty: 90 | Fill #0

## 2020-09-20 MED FILL — FERROUS SULF EC 325 MG TAB: 325 (65 FE) | 100 days supply | Qty: 100 | Fill #0

## 2020-09-22 ENCOUNTER — Emergency Department (HOSPITAL_BASED_OUTPATIENT_CLINIC_OR_DEPARTMENT_OTHER)
Admission: EM | Admit: 2020-09-22 | Discharge: 2020-09-22 | Disposition: A | Discharge status: Home / Self Care | Payer: Medicare Other | Attending: Emergency Medicine | Admitting: Emergency Medicine

## 2020-09-22 ENCOUNTER — Encounter (HOSPITAL_BASED_OUTPATIENT_CLINIC_OR_DEPARTMENT_OTHER): Payer: Self-pay | Admitting: *Deleted

## 2020-09-22 ENCOUNTER — Other Ambulatory Visit (HOSPITAL_BASED_OUTPATIENT_CLINIC_OR_DEPARTMENT_OTHER): Payer: Self-pay | Admitting: Emergency Medicine

## 2020-09-22 ENCOUNTER — Other Ambulatory Visit: Payer: Self-pay

## 2020-09-22 ENCOUNTER — Emergency Department (HOSPITAL_BASED_OUTPATIENT_CLINIC_OR_DEPARTMENT_OTHER): Payer: Medicare Other

## 2020-09-22 DIAGNOSIS — R197 Diarrhea, unspecified: Secondary | ICD-10-CM | POA: Insufficient documentation

## 2020-09-22 DIAGNOSIS — R509 Fever, unspecified: Secondary | ICD-10-CM | POA: Insufficient documentation

## 2020-09-22 DIAGNOSIS — I10 Essential (primary) hypertension: Secondary | ICD-10-CM

## 2020-09-22 DIAGNOSIS — Z950 Presence of cardiac pacemaker: Secondary | ICD-10-CM | POA: Insufficient documentation

## 2020-09-22 DIAGNOSIS — Z20822 Contact with and (suspected) exposure to covid-19: Secondary | ICD-10-CM | POA: Diagnosis not present

## 2020-09-22 DIAGNOSIS — R112 Nausea with vomiting, unspecified: Secondary | ICD-10-CM | POA: Diagnosis not present

## 2020-09-22 DIAGNOSIS — I110 Hypertensive heart disease with heart failure: Secondary | ICD-10-CM | POA: Diagnosis not present

## 2020-09-22 DIAGNOSIS — Z7982 Long term (current) use of aspirin: Secondary | ICD-10-CM | POA: Diagnosis not present

## 2020-09-22 DIAGNOSIS — I509 Heart failure, unspecified: Secondary | ICD-10-CM | POA: Diagnosis not present

## 2020-09-22 DIAGNOSIS — R059 Cough, unspecified: Secondary | ICD-10-CM | POA: Diagnosis not present

## 2020-09-22 LAB — COMPREHENSIVE METABOLIC PANEL
ALT: 22 U/L (ref 0–44)
AST: 21 U/L (ref 15–41)
Albumin: 3.9 g/dL (ref 3.5–5.0)
Alkaline Phosphatase: 44 U/L (ref 38–126)
Anion gap: 8 (ref 5–15)
BUN: 23 mg/dL (ref 8–23)
CO2: 23 mmol/L (ref 22–32)
Calcium: 9.9 mg/dL (ref 8.9–10.3)
Chloride: 110 mmol/L (ref 98–111)
Creatinine, Ser: 1.26 mg/dL — ABNORMAL HIGH (ref 0.44–1.00)
GFR, Estimated: 39 mL/min — ABNORMAL LOW (ref >60)
Glucose, Bld: 79 mg/dL (ref 70–99)
Potassium: 4.1 mmol/L (ref 3.5–5.1)
Sodium: 141 mmol/L (ref 135–145)
Total Bilirubin: 0.3 mg/dL (ref 0.3–1.2)
Total Protein: 6.3 g/dL — ABNORMAL LOW (ref 6.5–8.1)

## 2020-09-22 LAB — CBC
HCT: 34.7 % — ABNORMAL LOW (ref 36.0–46.0)
Hemoglobin: 11.1 g/dL — ABNORMAL LOW (ref 12.0–15.0)
MCH: 32.9 pg (ref 26.0–34.0)
MCHC: 32 g/dL (ref 30.0–36.0)
MCV: 103 fL — ABNORMAL HIGH (ref 80.0–100.0)
Platelets: 194 10*3/uL (ref 150–400)
RBC: 3.37 MIL/uL — ABNORMAL LOW (ref 3.87–5.11)
RDW: 13.5 % (ref 11.5–15.5)
WBC: 7.4 10*3/uL (ref 4.0–10.5)
nRBC: 0 % (ref 0.0–0.2)

## 2020-09-22 LAB — LIPASE, BLOOD: Lipase: 25 U/L (ref 11–51)

## 2020-09-22 MED ORDER — AMLODIPINE BESYLATE 5 MG PO TABS
5.0000 mg | ORAL_TABLET | Freq: Once | ORAL | Status: AC
  Administered 2020-09-22: 5 mg via ORAL
  Filled 2020-09-22: qty 1

## 2020-09-22 MED ORDER — ACETAMINOPHEN 325 MG PO TABS
650.0000 mg | ORAL_TABLET | Freq: Once | ORAL | Status: AC
  Administered 2020-09-22: 650 mg via ORAL
  Filled 2020-09-22: qty 2

## 2020-09-22 MED ORDER — AMLODIPINE BESYLATE 2.5 MG PO TABS
2.5000 mg | ORAL_TABLET | Freq: Every day | ORAL | 0 refills | Status: DC
Start: 2020-09-22 — End: 2020-09-22

## 2020-09-22 MED ORDER — ONDANSETRON HCL 4 MG/2ML IJ SOLN
4.0000 mg | Freq: Once | INTRAMUSCULAR | Status: DC
  Filled 2020-09-22: qty 2

## 2020-09-22 MED ORDER — SODIUM CHLORIDE 0.9 % IV BOLUS
500.0000 mL | Freq: Once | INTRAVENOUS | Status: AC
  Administered 2020-09-22: 500 mL via INTRAVENOUS

## 2020-09-22 MED ORDER — ONDANSETRON 4 MG PO TBDP: ORAL_TABLET | ORAL | 0 refills | Status: DC

## 2020-09-22 NOTE — Discharge Instructions (Addendum)
Your COVID test is pending and should result tomorrow. If you are COVID positive, you will qualify for infusion and infusion clinic will contact you.   Take norvasc 2.5 mg daily for elevated blood pressure   Take zofran for nausea   Stay hydrated   See your doctor for follow up   Return to ER if you have worse abdominal pain, vomiting, dehydration, trouble breathing

## 2020-09-22 NOTE — ED Notes (Signed)
X-ray at bedside

## 2020-09-22 NOTE — ED Triage Notes (Signed)
Pt presents with n/v this am and diarrhea yesterday. She has family members in the home that are + for covid

## 2020-09-22 NOTE — ED Notes (Signed)
See EDP assessment 

## 2020-09-22 NOTE — ED Provider Notes (Signed)
[facility] EMERGENCY DEPARTMENT Provider Note   CSN: [id number] Arrival date & time: 09/22/20  1316     History Chief Complaint  Patient presents with  . Emesis    Cassandra Patel is a 87 y.o. female history of high cholesterol, hypertension, CHF here presenting with vomiting and diarrhea.  Patient states that she lives at home with multiple family members.  Her granddaughter was sick last week and got tested positive for COVID and the test result came back 3 days ago.  Patient has been having some nausea vomiting since yesterday.  Also has nonproductive cough as well and low-grade temperature.  Patient was fully vaccinated and received a booster shot.  Patient denies any trouble breathing.  The history is provided by the patient.       Past Medical History:  Diagnosis Date  . Arthritis   . CHF (congestive heart failure) (HCC)   . High cholesterol   . Hypertension   . Pacemaker     There are no problems to display for this patient.   Past Surgical History:  Procedure Laterality Date  . ABDOMINAL HYSTERECTOMY    . APPENDECTOMY    . CARDIAC SURGERY       OB History   No obstetric history on file.     No family history on file.  Social History   Tobacco Use  . Smoking status: Never Smoker  . Smokeless tobacco: Never Used  Vaping Use  . Vaping Use: Never used  Substance Use Topics  . Alcohol use: Not Currently  . Drug use: Not Currently    Home Medications Prior to Admission medications   Medication Sig Start Date End Date Taking? Authorizing Provider  acetaminophen-codeine (TYLENOL #3) 300-30 MG tablet Take 1 tablet by mouth every 4 (four) hours as needed for moderate pain. Takes 2 times a day    [provider]  aspirin 325 MG tablet Take 325 mg by mouth daily.    [provider]  dextromethorphan-guaiFENesin (MUCINEX DM) 30-600 MG 12hr tablet Take 1 tablet by mouth 2 (two) times daily.    [provider]  diltiazem  (CARDIZEM) 120 MG tablet Take by mouth at bedtime. 03/26/20   [provider]  furosemide (LASIX) 40 MG tablet Take 40 mg by mouth daily. 03/31/20   [provider]  isosorbide mononitrate (ISMO) 20 MG tablet Take 20 mg by mouth 2 (two) times daily. 04/29/20   [provider]  potassium chloride SA (KLOR-CON) 20 MEQ tablet Take 20 mEq by mouth daily. 04/29/20   [provider]  rosuvastatin (CRESTOR) 10 MG tablet Take 10 mg by mouth daily. 04/29/20   [provider]    Allergies    Codeine  Review of Systems   Review of Systems  Gastrointestinal: Positive for diarrhea and vomiting.  All other systems reviewed and are negative.   Physical Exam Updated Vital Signs BP (!) 191/54 (BP Location: Left Arm)   Pulse 60   Temp 99.6 F (37.6 C) (Tympanic)   Resp 17   Ht 4\' 11"  (1.499 m)   Wt 46.7 kg   SpO2 100%   BMI 20.80 kg/m   Physical Exam Vitals and nursing note reviewed.  Constitutional:      Comments: Chronically ill, slightly dehydrated   HENT:     Head: Normocephalic.     Nose: Nose normal.     Mouth/Throat:     Mouth: Mucous membranes are dry.  Eyes:  Extraocular Movements: Extraocular movements intact.     Pupils: Pupils are equal, round, and reactive to light.  Cardiovascular:     Rate and Rhythm: Normal rate and regular rhythm.     Pulses: Normal pulses.     Heart sounds: Normal heart sounds.  Pulmonary:     Effort: Pulmonary effort is normal.     Comments: Diminished bilaterally, no obvious crackles or wheezing  Abdominal:     General: Abdomen is flat.     Palpations: Abdomen is soft.  Musculoskeletal:        General: Normal range of motion.     Cervical back: Normal range of motion and neck supple.  Skin:    General: Skin is warm.     Capillary Refill: Capillary refill takes less than 2 seconds.  Neurological:     General: No focal deficit present.     Mental Status: She is oriented to person, place, and time.   Psychiatric:        Mood and Affect: Mood normal.        Behavior: Behavior normal.     ED Results / Procedures / Treatments   Labs (all labs ordered are listed, but only abnormal results are displayed) Labs Reviewed  COMPREHENSIVE METABOLIC PANEL - Abnormal; Notable for the following components:      Result Value   Creatinine, Ser 1.26 (*)    Total Protein 6.3 (*)    GFR, Estimated 39 (*)    All other components within normal limits  CBC - Abnormal; Notable for the following components:   RBC 3.37 (*)    Hemoglobin 11.1 (*)    HCT 34.7 (*)    MCV 103.0 (*)    All other components within normal limits  SARS CORONAVIRUS 2 (TAT 6-24 HRS)  LIPASE, BLOOD  URINALYSIS, ROUTINE W REFLEX MICROSCOPIC    EKG None  Radiology DG Chest Port 1 View  Result Date: 09/22/2020 CLINICAL DATA:  Nausea and vomiting, diarrhea, COVID-19 exposure EXAM: PORTABLE CHEST 1 VIEW COMPARISON:  08/30/2020 FINDINGS: Single frontal view of the chest demonstrates stable dual lead pacer. Cardiac silhouette is unremarkable. Background scarring without airspace disease, effusion, or pneumothorax. No acute bony abnormalities. IMPRESSION: 1. No acute intrathoracic process. Electronically Signed   By: Sharlet Salina M.D.   On: 09/22/2020 17:20    Procedures Procedures (including critical care time)  Medications Ordered in ED Medications  ondansetron (ZOFRAN) injection 4 mg (has no administration in time range)  amLODipine (NORVASC) tablet 5 mg (has no administration in time range)  sodium chloride 0.9 % bolus 500 mL ( Intravenous Stopped 09/22/20 1751)  acetaminophen (TYLENOL) tablet 650 mg (650 mg Oral Given 09/22/20 1711)    ED Course  I have reviewed the triage vital signs and the nursing notes.  Pertinent labs & imaging results that were available during my care of the patient were reviewed by me and considered in my medical decision making (see chart for details).    MDM Rules/Calculators/A&P                           Cassandra Patel is a 87 y.o. female here with vomiting, diarrhea. Family member tested positive for COVID last week. Likely COVID vs viral gastroenteritis. Appears well for age. Not hypoxic, fully vaccinated and received booster shot. Will get covid test, cbc, cmp, ua. Will hydrate and reassess.   7:09 PM Labs unremarkable. Of note, her BP is elevated  at 190. She is on cardizem but no BP meds. Will start low dose norvasc. No vomiting in the ED. COVID test pending. Since she has family member with COVID, she likely has COVID. She is not hypoxic and doesn't need admission even if she has COVID. Will refer to infusion clinic.    Final Clinical Impression(s) / ED Diagnoses Final diagnoses:  None    Rx / DC Orders ED Discharge Orders    None       Charlynne Pander, MD 09/22/20 1910

## 2020-09-23 LAB — SARS CORONAVIRUS 2 (TAT 6-24 HRS): SARS Coronavirus 2: NEGATIVE

## 2020-09-23 MED FILL — AMLODIPINE 2.5 MG TABLET: 2.5 | 30 days supply | Qty: 30 | Fill #0

## 2020-09-25 ENCOUNTER — Telehealth: Payer: Self-pay | Admitting: *Deleted

## 2020-09-25 NOTE — Telephone Encounter (Signed)
Pt notified of negative COVID-19 results. Understanding verbalized.   

## 2020-10-01 ENCOUNTER — Other Ambulatory Visit (HOSPITAL_BASED_OUTPATIENT_CLINIC_OR_DEPARTMENT_OTHER): Payer: Self-pay | Admitting: Pediatrics

## 2020-10-01 MED FILL — MOXIFLOXACIN HCL 400 MG TAB: 400 | 21 days supply | Qty: 21 | Fill #0

## 2020-10-03 ENCOUNTER — Other Ambulatory Visit (HOSPITAL_BASED_OUTPATIENT_CLINIC_OR_DEPARTMENT_OTHER): Payer: Self-pay | Admitting: Internal Medicine

## 2020-10-03 MED FILL — GABAPENTIN 300 MG CAPSULE: 300 | 30 days supply | Qty: 60 | Fill #0

## 2020-10-04 MED FILL — ISOSORBIDE MN 20 MG TABLET: 20 | 30 days supply | Qty: 60 | Fill #1

## 2020-10-04 MED FILL — dilTIAZem HCL 120 MG TABS: 120 | 90 days supply | Qty: 45 | Fill #0

## 2020-10-08 ENCOUNTER — Other Ambulatory Visit: Payer: Self-pay

## 2020-10-08 ENCOUNTER — Emergency Department (HOSPITAL_BASED_OUTPATIENT_CLINIC_OR_DEPARTMENT_OTHER)
Admission: EM | Admit: 2020-10-08 | Discharge: 2020-10-08 | Disposition: A | Discharge status: Home / Self Care | Payer: Medicare Other | Attending: Emergency Medicine | Admitting: Emergency Medicine

## 2020-10-08 ENCOUNTER — Emergency Department (HOSPITAL_BASED_OUTPATIENT_CLINIC_OR_DEPARTMENT_OTHER): Payer: Medicare Other

## 2020-10-08 ENCOUNTER — Other Ambulatory Visit (HOSPITAL_BASED_OUTPATIENT_CLINIC_OR_DEPARTMENT_OTHER): Payer: Self-pay | Admitting: Emergency Medicine

## 2020-10-08 ENCOUNTER — Encounter (HOSPITAL_BASED_OUTPATIENT_CLINIC_OR_DEPARTMENT_OTHER): Payer: Self-pay

## 2020-10-08 DIAGNOSIS — M25552 Pain in left hip: Secondary | ICD-10-CM | POA: Insufficient documentation

## 2020-10-08 DIAGNOSIS — I509 Heart failure, unspecified: Secondary | ICD-10-CM | POA: Insufficient documentation

## 2020-10-08 DIAGNOSIS — Z79899 Other long term (current) drug therapy: Secondary | ICD-10-CM | POA: Insufficient documentation

## 2020-10-08 DIAGNOSIS — S4992XA Unspecified injury of left shoulder and upper arm, initial encounter: Secondary | ICD-10-CM | POA: Diagnosis present

## 2020-10-08 DIAGNOSIS — I10 Essential (primary) hypertension: Secondary | ICD-10-CM | POA: Diagnosis not present

## 2020-10-08 DIAGNOSIS — Z7982 Long term (current) use of aspirin: Secondary | ICD-10-CM | POA: Diagnosis not present

## 2020-10-08 DIAGNOSIS — W19XXXA Unspecified fall, initial encounter: Secondary | ICD-10-CM | POA: Diagnosis not present

## 2020-10-08 DIAGNOSIS — Z9181 History of falling: Secondary | ICD-10-CM

## 2020-10-08 DIAGNOSIS — S42212A Unspecified displaced fracture of surgical neck of left humerus, initial encounter for closed fracture: Secondary | ICD-10-CM | POA: Insufficient documentation

## 2020-10-08 DIAGNOSIS — S42202A Unspecified fracture of upper end of left humerus, initial encounter for closed fracture: Secondary | ICD-10-CM

## 2020-10-08 MED ORDER — TRAMADOL HCL 50 MG PO TABS: 50.0000 mg | ORAL_TABLET | Freq: Two times a day (BID) | ORAL | 0 refills | Status: DC | PRN

## 2020-10-08 MED ORDER — TRAMADOL HCL 50 MG PO TABS
50.0000 mg | ORAL_TABLET | Freq: Once | ORAL | Status: AC
  Administered 2020-10-08: 50 mg via ORAL
  Filled 2020-10-08: qty 1

## 2020-10-08 NOTE — ED Provider Notes (Signed)
[facility] EMERGENCY DEPARTMENT Provider Note   CSN: [id number] Arrival date & time: 10/08/20  1658     History Chief Complaint  Patient presents with  . Extremity Weakness  . Arm Pain    Cassandra Patel is a 87 y.o. female.  Patient with hx chf, c/o left shoulder pain in the past few days. Pt with hx falls, and recently evaluated by pcp for some right sided pain. Now in past couple days, left shoulder pain and not wanting to use arm due to pain. Daughter was unaware of new fall or injury. Pain c/o left shoulder pain, constant, dull, mod-severe, non radiating, worse w movement. Pt denies headache. No neck or back pain. No radicular pain. No numbness/weakness. Denies other pain or injury.   The history is provided by the patient and a relative. The history is limited by the condition of the patient.  Extremity Weakness Pertinent negatives include no chest pain, no abdominal pain, no headaches and no shortness of breath.  Arm Pain Pertinent negatives include no chest pain, no abdominal pain, no headaches and no shortness of breath.       Past Medical History:  Diagnosis Date  . Arthritis   . CHF (congestive heart failure) (HCC)   . High cholesterol   . Hypertension   . Pacemaker     There are no problems to display for this patient.   Past Surgical History:  Procedure Laterality Date  . ABDOMINAL HYSTERECTOMY    . APPENDECTOMY    . CARDIAC SURGERY       OB History   No obstetric history on file.     History reviewed. No pertinent family history.  Social History   Tobacco Use  . Smoking status: Never Smoker  . Smokeless tobacco: Never Used  Vaping Use  . Vaping Use: Never used  Substance Use Topics  . Alcohol use: Not Currently  . Drug use: Not Currently    Home Medications Prior to Admission medications   Medication Sig Start Date End Date Taking? Authorizing Provider  acetaminophen-codeine (TYLENOL #3) 300-30 MG tablet Take 1 tablet by mouth  every 4 (four) hours as needed for moderate pain. Takes 2 times a day    [provider]  amLODipine (NORVASC) 2.5 MG tablet Take 1 tablet (2.5 mg total) by mouth daily. 09/22/20   Charlynne Pander, MD  aspirin 325 MG tablet Take 325 mg by mouth daily.    [provider]  dextromethorphan-guaiFENesin (MUCINEX DM) 30-600 MG 12hr tablet Take 1 tablet by mouth 2 (two) times daily.    [provider]  diltiazem (CARDIZEM) 120 MG tablet Take by mouth at bedtime. 03/26/20   [provider]  furosemide (LASIX) 40 MG tablet Take 40 mg by mouth daily. 03/31/20   [provider]  isosorbide mononitrate (ISMO) 20 MG tablet Take 20 mg by mouth 2 (two) times daily. 04/29/20   [provider]  ondansetron (ZOFRAN ODT) 4 MG disintegrating tablet 4 mg ODT q4 hours prn vomiting 09/22/20   Charlynne Pander, MD  potassium chloride SA (KLOR-CON) 20 MEQ tablet Take 20 mEq by mouth daily. 04/29/20   [provider]  rosuvastatin (CRESTOR) 10 MG tablet Take 10 mg by mouth daily. 04/29/20   [provider]    Allergies    Codeine  Review of Systems   Review of Systems  Constitutional: Negative for chills and fever.  HENT: Negative for sore throat.   Eyes: Negative for  redness.  Respiratory: Negative for shortness of breath.   Cardiovascular: Negative for chest pain.  Gastrointestinal: Negative for abdominal pain, nausea and vomiting.  Genitourinary: Negative for flank pain.  Musculoskeletal: Positive for extremity weakness. Negative for back pain and neck pain.  Skin: Negative for wound.  Neurological: Negative for headaches.  Hematological: Does not bruise/bleed easily.  Psychiatric/Behavioral: Negative for confusion.    Physical Exam Updated Vital Signs BP (!) 197/161 (BP Location: Right Arm)   Pulse 64   Temp 98.9 F (37.2 C)   Resp 20   Ht 1.499 m (4\' 11" )   Wt 46.7 kg   SpO2 96%   BMI 20.80 kg/m   Physical Exam Vitals and  nursing note reviewed.  Constitutional:      Appearance: Normal appearance. She is well-developed.  HENT:     Head: Atraumatic.     Nose: Nose normal.     Mouth/Throat:     Mouth: Mucous membranes are moist.  Eyes:     General: No scleral icterus.    Conjunctiva/sclera: Conjunctivae normal.     Pupils: Pupils are equal, round, and reactive to light.  Neck:     Vascular: No carotid bruit.     Trachea: No tracheal deviation.  Cardiovascular:     Rate and Rhythm: Normal rate and regular rhythm.     Pulses: Normal pulses.     Heart sounds: Normal heart sounds. No murmur heard. No friction rub. No gallop.   Pulmonary:     Effort: Pulmonary effort is normal. No respiratory distress.     Breath sounds: Normal breath sounds.  Chest:     Chest wall: No tenderness.  Abdominal:     General: Bowel sounds are normal. There is no distension.     Palpations: Abdomen is soft.     Tenderness: There is no abdominal tenderness. There is no guarding.  Genitourinary:    Comments: No cva tenderness.  Musculoskeletal:        General: No swelling.     Cervical back: Normal range of motion and neck supple. No rigidity or tenderness. No muscular tenderness.     Comments: CTLS spine, non tender, aligned, no step off. STS and tenderness left shoulder and upper arm. Skin intact. Compartments of arm/forearm are soft, not tense. Radial pulse 2+. Mild tenderness left hip. No other focal bony tenderness noted.   Skin:    General: Skin is warm and dry.     Findings: No rash.  Neurological:     Mental Status: She is alert.     Comments: Alert, speech normal. Motor intact bil, stre intact. Sens grossly intact.   Psychiatric:        Mood and Affect: Mood normal.     ED Results / Procedures / Treatments   Labs (all labs ordered are listed, but only abnormal results are displayed) Labs Reviewed - No data to display  EKG None  Radiology DG Shoulder Left  Result Date: 10/08/2020 CLINICAL DATA:   Initial evaluation for acute swelling and bruising of left upper extremity. EXAM: LEFT SHOULDER - 2+ VIEW COMPARISON:  None. FINDINGS: Cortical irregularity and defect at the left humeral head/neck, consistent with an acute fracture. Associated mild impaction. Humeral head remains in normal alignment with the glenoid. Glenoid itself grossly intact. Scattered osteoarthritic changes noted about the shoulder. Associated mild soft tissue swelling. Left-sided pacemaker/AICD noted. Cardiomegaly with aortic atherosclerosis noted as well. Visualized lungs are grossly clear. IMPRESSION: 1. Acute mildly impacted fracture at  the left humeral head/neck. 2.  Aortic Atherosclerosis (ICD10-I70.0). Electronically Signed   By: Rise Mu M.D.   On: 10/08/2020 18:07   DG Humerus Left  Result Date: 10/08/2020 CLINICAL DATA:  Initial evaluation for acute swelling and bruising to left upper extremity. EXAM: LEFT HUMERUS - 2+ VIEW COMPARISON:  Concomitant radiograph of the left shoulder. FINDINGS: Acute mildly impacted fracture of the left humeral head/neck again seen. Left humerus otherwise intact. No visible abnormality about the left elbow. Soft tissue swelling overlies the left shoulder. Osteoarthritic changes about the left shoulder joint noted as well. Left-sided pacemaker/AICD noted. Visualized left lung is grossly clear. IMPRESSION: 1. Acute mildly impacted fracture of the left humeral head/neck. 2. No other acute osseous abnormality about the left humerus. Electronically Signed   By: Rise Mu M.D.   On: 10/08/2020 18:08   DG Hip Unilat W or Wo Pelvis 2-3 Views Left  Result Date: 10/08/2020 CLINICAL DATA:  Initial evaluation for possible hip fracture, acute left hip pain. EXAM: DG HIP (WITH OR WITHOUT PELVIS) 2-3V LEFT COMPARISON:  None available. FINDINGS: No acute fracture or dislocation. Femoral head in normal alignment with the acetabulum. Femoral head height maintained. Bony pelvis intact. Limited  views of the right hip demonstrate no acute abnormality. Advanced degenerative spondylosis noted within the visualized lower lumbar spine. Underlying osteopenia noted. Scattered vascular calcifications noted. No visible soft tissue injury. IMPRESSION: 1. No acute osseous abnormality about the left hip. 2. Advanced degenerative spondylosis within the visualized lower lumbar spine. Electronically Signed   By: Rise Mu M.D.   On: 10/08/2020 18:10    Procedures Procedures   Medications Ordered in ED Medications - No data to display  ED Course  I have reviewed the triage vital signs and the nursing notes.  Pertinent labs & imaging results that were available during my care of the patient were reviewed by me and considered in my medical decision making (see chart for details).    MDM Rules/Calculators/A&P                         Xrays.  Reviewed nursing notes and prior charts for additional history.   Xrays reviewed/interpreted by me - left humeral neck fracture.   Pt initially hesitant to accept pain med ?remote hx fentanyl use/abuse. On recheck requests something for pain. Ultram po.   Left shoulder sling/immobilizer.   Family indicate they already have home health in place, but would appreciate if could be increased/maximized - will order full home health services.   Recheck, pt/family indicate mental status at baseline. Pt denies any c/o other than then left shoulder pain/injury.   Pt currently appears stable for d/c.      Final Clinical Impression(s) / ED Diagnoses Final diagnoses:  None    Rx / DC Orders ED Discharge Orders    None       Cathren Laine, MD 10/08/20 [id number]

## 2020-10-08 NOTE — ED Triage Notes (Signed)
Pt arrives with daughter who states left arm has been weak since Saturday. Has been progressively worsening. Painful to touch entire arm. Hx of stroke 30 years ago. Able to move fingers but arm is flaccid.

## 2020-10-08 NOTE — ED Notes (Signed)
Pt yelped when we moved her left arm and shoulder and had bruising to lft  t inner elbow , told daughter she did not remember falling , left arm swollen to wrist, has good pulse and can wiggle fingers

## 2020-10-08 NOTE — Discharge Instructions (Addendum)
It was our pleasure to provide your ER care today - we hope that you feel better.  Wear shoulder sling/immobilizer. Take acetaminophen as need for pain. Fall precautions.   You may also take ultram as need for pain.  Follow up with orthopedic doctor in 1 week - call office to arrange appointment.   We placed order to maximize your home health services.   Return to ER if worse, new symptoms, new or severe pain, trouble breathing, or other emergency concern.

## 2020-10-08 NOTE — ED Notes (Signed)
Upon moving pt to stretcher and changing into gown, left shoulder noted to be swollen, as well as wrist, large bruise noted to bicep/upper arm, yelling out in pain with any movement to left arm.

## 2020-10-09 MED FILL — traMADol HCL 50 MG TABS: 50 | 7 days supply | Qty: 15 | Fill #0

## 2020-10-10 ENCOUNTER — Other Ambulatory Visit (HOSPITAL_BASED_OUTPATIENT_CLINIC_OR_DEPARTMENT_OTHER): Payer: Self-pay | Admitting: Internal Medicine

## 2020-10-10 MED FILL — DONEPEZIL HCL 10 MG TABLET: 10 | 30 days supply | Qty: 30 | Fill #0

## 2020-10-15 ENCOUNTER — Other Ambulatory Visit (HOSPITAL_BASED_OUTPATIENT_CLINIC_OR_DEPARTMENT_OTHER): Payer: Self-pay | Admitting: Sports Medicine

## 2020-10-16 MED FILL — traMADol HCL 50 MG TABS: 50 | 5 days supply | Qty: 24 | Fill #0

## 2020-10-23 MED FILL — BUPRENORPHINE 5 MCG/HR PTCH: 5 | 28 days supply | Qty: 4 | Fill #0

## 2020-10-24 ENCOUNTER — Other Ambulatory Visit (HOSPITAL_BASED_OUTPATIENT_CLINIC_OR_DEPARTMENT_OTHER): Payer: Self-pay | Admitting: Sports Medicine

## 2020-10-24 MED FILL — MELOXICAM 15 MG TABLET: 15 | 30 days supply | Qty: 30 | Fill #0

## 2020-10-30 ENCOUNTER — Other Ambulatory Visit (HOSPITAL_BASED_OUTPATIENT_CLINIC_OR_DEPARTMENT_OTHER): Payer: Self-pay | Admitting: Internal Medicine

## 2020-11-14 ENCOUNTER — Other Ambulatory Visit (HOSPITAL_BASED_OUTPATIENT_CLINIC_OR_DEPARTMENT_OTHER): Payer: Self-pay | Admitting: Sports Medicine

## 2020-12-30 ENCOUNTER — Other Ambulatory Visit (HOSPITAL_BASED_OUTPATIENT_CLINIC_OR_DEPARTMENT_OTHER): Payer: Self-pay

## 2020-12-30 MED ORDER — CLOPIDOGREL BISULFATE 75 MG PO TABS
ORAL_TABLET | ORAL | 0 refills | Status: AC
  Filled 2020-12-30: qty 19, 19d supply, fill #0

## 2021-01-09 ENCOUNTER — Other Ambulatory Visit (HOSPITAL_BASED_OUTPATIENT_CLINIC_OR_DEPARTMENT_OTHER): Payer: Self-pay

## 2021-01-09 MED ORDER — BUPRENORPHINE 5 MCG/HR TD PTWK
MEDICATED_PATCH | TRANSDERMAL | 2 refills | Status: DC
  Filled 2021-01-09: qty 4, 28d supply, fill #0

## 2021-01-10 ENCOUNTER — Other Ambulatory Visit (HOSPITAL_BASED_OUTPATIENT_CLINIC_OR_DEPARTMENT_OTHER): Payer: Self-pay

## 2021-01-15 ENCOUNTER — Other Ambulatory Visit (HOSPITAL_BASED_OUTPATIENT_CLINIC_OR_DEPARTMENT_OTHER): Payer: Self-pay

## 2021-01-17 ENCOUNTER — Other Ambulatory Visit (HOSPITAL_BASED_OUTPATIENT_CLINIC_OR_DEPARTMENT_OTHER): Payer: Self-pay

## 2021-01-17 MED ORDER — MELOXICAM 15 MG PO TABS
1.0000 | ORAL_TABLET | Freq: Every day | ORAL | 0 refills | Status: DC
  Filled 2021-01-17: qty 30, 30d supply, fill #0

## 2021-01-20 ENCOUNTER — Emergency Department (HOSPITAL_BASED_OUTPATIENT_CLINIC_OR_DEPARTMENT_OTHER): Payer: Medicare Other

## 2021-01-20 ENCOUNTER — Other Ambulatory Visit (HOSPITAL_BASED_OUTPATIENT_CLINIC_OR_DEPARTMENT_OTHER): Payer: Self-pay

## 2021-01-20 ENCOUNTER — Inpatient Hospital Stay (HOSPITAL_BASED_OUTPATIENT_CLINIC_OR_DEPARTMENT_OTHER)
Admission: EM | Admit: 2021-01-20 | Discharge: 2021-01-22 | DRG: 683 | Disposition: A | Discharge status: Home Health | Payer: Medicare Other | Attending: Internal Medicine | Admitting: Internal Medicine

## 2021-01-20 ENCOUNTER — Other Ambulatory Visit: Payer: Self-pay

## 2021-01-20 DIAGNOSIS — N179 Acute kidney failure, unspecified: Secondary | ICD-10-CM | POA: Diagnosis not present

## 2021-01-20 DIAGNOSIS — Z7982 Long term (current) use of aspirin: Secondary | ICD-10-CM

## 2021-01-20 DIAGNOSIS — E860 Dehydration: Secondary | ICD-10-CM | POA: Diagnosis present

## 2021-01-20 DIAGNOSIS — F0390 Unspecified dementia without behavioral disturbance: Secondary | ICD-10-CM | POA: Diagnosis present

## 2021-01-20 DIAGNOSIS — R5381 Other malaise: Secondary | ICD-10-CM | POA: Diagnosis present

## 2021-01-20 DIAGNOSIS — R531 Weakness: Secondary | ICD-10-CM

## 2021-01-20 DIAGNOSIS — I5032 Chronic diastolic (congestive) heart failure: Secondary | ICD-10-CM | POA: Diagnosis present

## 2021-01-20 DIAGNOSIS — Z7902 Long term (current) use of antithrombotics/antiplatelets: Secondary | ICD-10-CM

## 2021-01-20 DIAGNOSIS — Z20822 Contact with and (suspected) exposure to covid-19: Secondary | ICD-10-CM | POA: Diagnosis present

## 2021-01-20 DIAGNOSIS — Z90710 Acquired absence of both cervix and uterus: Secondary | ICD-10-CM

## 2021-01-20 DIAGNOSIS — N1832 Chronic kidney disease, stage 3b: Secondary | ICD-10-CM | POA: Diagnosis present

## 2021-01-20 DIAGNOSIS — I10 Essential (primary) hypertension: Secondary | ICD-10-CM

## 2021-01-20 DIAGNOSIS — I130 Hypertensive heart and chronic kidney disease with heart failure and stage 1 through stage 4 chronic kidney disease, or unspecified chronic kidney disease: Secondary | ICD-10-CM | POA: Diagnosis present

## 2021-01-20 DIAGNOSIS — E7800 Pure hypercholesterolemia, unspecified: Secondary | ICD-10-CM | POA: Diagnosis present

## 2021-01-20 DIAGNOSIS — I509 Heart failure, unspecified: Secondary | ICD-10-CM

## 2021-01-20 DIAGNOSIS — Z79899 Other long term (current) drug therapy: Secondary | ICD-10-CM

## 2021-01-20 LAB — CBC WITH DIFFERENTIAL/PLATELET
Abs Immature Granulocytes: 0.01 10*3/uL (ref 0.00–0.07)
Basophils Absolute: 0.1 10*3/uL (ref 0.0–0.1)
Basophils Relative: 1 %
Eosinophils Absolute: 0.3 10*3/uL (ref 0.0–0.5)
Eosinophils Relative: 6 %
HCT: 36.8 % (ref 36.0–46.0)
Hemoglobin: 11.6 g/dL — ABNORMAL LOW (ref 12.0–15.0)
Immature Granulocytes: 0 %
Lymphocytes Relative: 27 %
Lymphs Abs: 1.4 10*3/uL (ref 0.7–4.0)
MCH: 31.9 pg (ref 26.0–34.0)
MCHC: 31.5 g/dL (ref 30.0–36.0)
MCV: 101.1 fL — ABNORMAL HIGH (ref 80.0–100.0)
Monocytes Absolute: 0.4 10*3/uL (ref 0.1–1.0)
Monocytes Relative: 8 %
Neutro Abs: 3 10*3/uL (ref 1.7–7.7)
Neutrophils Relative %: 58 %
Platelets: 170 10*3/uL (ref 150–400)
RBC: 3.64 MIL/uL — ABNORMAL LOW (ref 3.87–5.11)
RDW: 12.9 % (ref 11.5–15.5)
WBC: 5.2 10*3/uL (ref 4.0–10.5)
nRBC: 0 % (ref 0.0–0.2)

## 2021-01-20 LAB — COMPREHENSIVE METABOLIC PANEL
ALT: 11 U/L (ref 0–44)
AST: 25 U/L (ref 15–41)
Albumin: 4.1 g/dL (ref 3.5–5.0)
Alkaline Phosphatase: 83 U/L (ref 38–126)
Anion gap: 7 (ref 5–15)
BUN: 44 mg/dL — ABNORMAL HIGH (ref 8–23)
CO2: 22 mmol/L (ref 22–32)
Calcium: 10 mg/dL (ref 8.9–10.3)
Chloride: 110 mmol/L (ref 98–111)
Creatinine, Ser: 2.1 mg/dL — ABNORMAL HIGH (ref 0.44–1.00)
GFR, Estimated: 21 mL/min — ABNORMAL LOW (ref >60)
Glucose, Bld: 115 mg/dL — ABNORMAL HIGH (ref 70–99)
Potassium: 5.3 mmol/L — ABNORMAL HIGH (ref 3.5–5.1)
Sodium: 139 mmol/L (ref 135–145)
Total Bilirubin: 0.5 mg/dL (ref 0.3–1.2)
Total Protein: 7.2 g/dL (ref 6.5–8.1)

## 2021-01-20 LAB — URINALYSIS, ROUTINE W REFLEX MICROSCOPIC
Bilirubin Urine: NEGATIVE
Glucose, UA: NEGATIVE mg/dL
Hgb urine dipstick: NEGATIVE
Ketones, ur: NEGATIVE mg/dL
Leukocytes,Ua: NEGATIVE
Nitrite: NEGATIVE
Protein, ur: NEGATIVE mg/dL
Specific Gravity, Urine: 1.025 (ref 1.005–1.030)
pH: 5.5 (ref 5.0–8.0)

## 2021-01-20 LAB — CBG MONITORING, ED: Glucose-Capillary: 104 mg/dL — ABNORMAL HIGH (ref 70–99)

## 2021-01-20 LAB — RESP PANEL BY RT-PCR (FLU A&B, COVID) ARPGX2
Influenza A by PCR: NEGATIVE
Influenza B by PCR: NEGATIVE
SARS Coronavirus 2 by RT PCR: NEGATIVE

## 2021-01-20 LAB — AMMONIA: Ammonia: 21 umol/L (ref 9–35)

## 2021-01-20 LAB — SARS CORONAVIRUS 2 (TAT 6-24 HRS): SARS Coronavirus 2: NEGATIVE

## 2021-01-20 MED ORDER — SODIUM CHLORIDE 0.9 % IV BOLUS
500.0000 mL | Freq: Once | INTRAVENOUS | Status: AC
  Administered 2021-01-20: 500 mL via INTRAVENOUS

## 2021-01-20 MED ORDER — ISOSORBIDE MONONITRATE 10 MG PO TABS
20.0000 mg | ORAL_TABLET | Freq: Two times a day (BID) | ORAL | Status: DC
  Administered 2021-01-21 – 2021-01-22 (×4): 20 mg via ORAL
  Filled 2021-01-20 (×4): qty 2

## 2021-01-20 MED ORDER — ONDANSETRON HCL 4 MG/2ML IJ SOLN
4.0000 mg | Freq: Once | INTRAMUSCULAR | Status: AC
  Administered 2021-01-20: 4 mg via INTRAVENOUS
  Filled 2021-01-20: qty 2

## 2021-01-20 MED ORDER — DONEPEZIL HCL 10 MG PO TABS
10.0000 mg | ORAL_TABLET | Freq: Every day | ORAL | Status: DC
  Administered 2021-01-21 (×2): 10 mg via ORAL
  Filled 2021-01-20 (×2): qty 1

## 2021-01-20 MED ORDER — AMLODIPINE BESYLATE 2.5 MG PO TABS
2.5000 mg | ORAL_TABLET | Freq: Every day | ORAL | 1 refills | Status: DC
  Filled 2021-01-20: qty 90, 90d supply, fill #0

## 2021-01-20 MED ORDER — DILTIAZEM HCL 60 MG PO TABS
60.0000 mg | ORAL_TABLET | Freq: Two times a day (BID) | ORAL | Status: DC
  Administered 2021-01-21 – 2021-01-22 (×2): 60 mg via ORAL
  Filled 2021-01-20 (×4): qty 1

## 2021-01-20 MED ORDER — ASPIRIN 325 MG PO TABS
325.0000 mg | ORAL_TABLET | Freq: Every day | ORAL | Status: DC
  Administered 2021-01-20 – 2021-01-22 (×3): 325 mg via ORAL
  Filled 2021-01-20 (×3): qty 1

## 2021-01-20 MED ORDER — ROSUVASTATIN CALCIUM 10 MG PO TABS
10.0000 mg | ORAL_TABLET | Freq: Every day | ORAL | Status: DC
  Administered 2021-01-21 – 2021-01-22 (×2): 10 mg via ORAL
  Filled 2021-01-20: qty 2
  Filled 2021-01-20: qty 1

## 2021-01-20 MED ORDER — DILTIAZEM HCL 30 MG PO TABS: 120.0000 mg | ORAL_TABLET | Freq: Every day | ORAL | Status: DC

## 2021-01-20 MED ORDER — LACTATED RINGERS IV SOLN: INTRAVENOUS | Status: DC

## 2021-01-20 NOTE — ED Notes (Signed)
Pt resting, dtg has left.  Pt still awaiting room at [facility]

## 2021-01-20 NOTE — ED Triage Notes (Signed)
C/O nausea, weakness and confusion since last Friday Vomit x1 this morning  Denies diarrhea

## 2021-01-20 NOTE — ED Provider Notes (Signed)
[facility] EMERGENCY DEPARTMENT Provider Note   CSN: [id number] Arrival date & time: 01/20/21  1037     History Chief Complaint  Patient presents with  . Weakness    Cassandra Patel is a 87 y.o. female.  She is brought in by her granddaughter for evaluation of weakness unsteadiness along with a cough vomiting.  Patient states she feels very cold.  She usually uses a walker to get around but has been in the wheelchair for the last few days.  It sounds like she just came home from rehab.  No known fevers.  Patient denies headache chest pain abdominal pain or urinary symptoms.  No recent falls.  The history is provided by the patient and a relative.  Weakness Severity:  Moderate Onset quality:  Gradual Timing:  Constant Progression:  Worsening Relieved by:  None tried Worsened by:  Activity Ineffective treatments:  None tried Associated symptoms: cough, difficulty walking, nausea and vomiting   Associated symptoms: no abdominal pain, no chest pain, no dysuria, no falls, no fever, no foul-smelling urine, no frequency, no headaches and no shortness of breath        Past Medical History:  Diagnosis Date  . Arthritis   . CHF (congestive heart failure) (HCC)   . High cholesterol   . Hypertension   . Pacemaker     There are no problems to display for this patient.   Past Surgical History:  Procedure Laterality Date  . ABDOMINAL HYSTERECTOMY    . APPENDECTOMY    . CARDIAC SURGERY       OB History   No obstetric history on file.     No family history on file.  Social History   Tobacco Use  . Smoking status: Never Smoker  . Smokeless tobacco: Never Used  Vaping Use  . Vaping Use: Never used  Substance Use Topics  . Alcohol use: Not Currently  . Drug use: Not Currently    Home Medications Prior to Admission medications   Medication Sig Start Date End Date Taking? Authorizing Provider  acetaminophen-codeine (TYLENOL #3) 300-30 MG tablet Take 1 tablet  by mouth every 4 (four) hours as needed for moderate pain. Takes 2 times a day    [provider]  amLODipine (NORVASC) 2.5 MG tablet TAKE 1 TABLET (2.5 MG TOTAL) BY MOUTH DAILY. 09/22/20 09/22/21  Charlynne PanderYao, David Hsienta, MD  aspirin 325 MG tablet Take 325 mg by mouth daily.    [provider]  buprenorphine (BUTRANS) 5 MCG/HR PTWK Place 1 patch onto the skin every 7 days. 01/09/21     clopidogrel (PLAVIX) 75 MG tablet Take 1 tablet (75 mg total) by mouth daily. Start date: 12/31/2020 Stop date: 01/19/2021 12/30/20     COVID-19 mRNA vaccine, Pfizer, 30 MCG/0.3ML injection INJECT AS DIRECTED 08/09/20 08/09/21  Judyann MunsonSnider, Cynthia, MD  dextromethorphan-guaiFENesin [facility](MUCINEX DM) 30-600 MG 12hr tablet Take 1 tablet by mouth 2 (two) times daily.    [provider]  diltiazem (CARDIZEM) 120 MG tablet Take by mouth at bedtime. 03/26/20   [provider]  diltiazem (CARDIZEM) 120 MG tablet TAKE 1/2 TABLET BY MOUTH DAILY 07/02/20 07/02/21  Caffie DammeSmith, Karla, MD  donepezil (ARICEPT) 10 MG tablet TAKE 1 TABLET (10 MG TOTAL) BY MOUTH NIGHTLY FOR 30 DAYS. 10/10/20 10/10/21  Leimberger, Veneta Pentonavid Neil, PA-C  doxycycline (VIBRAMYCIN) 100 MG capsule TAKE 1 CAPSULE BY MOUTH TWICE DAILY FOR 7 DAYS 08/30/20 08/30/21  Cheryll CockayneHong, Joshua S, MD  furosemide (LASIX) 40 MG tablet Take  40 mg by mouth daily. 03/31/20   [provider]  gabapentin (NEURONTIN) 300 MG capsule TAKE 1 CAPSULE (300 MG TOTAL) BY MOUTH 2 TIMES DAILY FOR 30 DAYS. 10/03/20 10/03/21  Leimberger, [name], PA-C  HYDROcodone-acetaminophen (NORCO/VICODIN) 5-325 MG tablet TAKE 1 TABLET BY MOUTH EVERY 6 (SIX) HOURS AS NEEDED FOR UP TO 5 DAYS FOR SEVERE PAIN (7-10). 10/30/20 04/28/21  Iroetugo, Innocent A, MD  isosorbide mononitrate (ISMO) 20 MG tablet Take 20 mg by mouth 2 (two) times daily. 04/29/20   [provider]  isosorbide mononitrate (ISMO) 20 MG tablet TAKE 1 TABLET BY MOUTH TWICE DAILY 01/08/20 01/07/21    levofloxacin (LEVAQUIN) 750 MG  tablet TAKE 1 TABLET (750 MG TOTAL) BY MOUTH DAILY FOR 7 DAYS. 09/19/20 09/19/21  Leimberger, [name], PA-C  meloxicam (MOBIC) 15 MG tablet TAKE 1 TABLET BY MOUTH DAILY 01/17/21     methylPREDNISolone (MEDROL DOSEPAK) 4 MG TBPK tablet TAKE 6 TABS BY MOUTH ON DAY 1; 5 TABS ON DAY 2; 4 TABS ON DAY 3; 3 TABS ON DAY 4; 2 TABS ON DAY 5; 1 TAB ON DAY 6 THEN STOP 11/14/20 11/14/21  Verlin Dike., MD  moxifloxacin (AVELOX) 400 MG tablet TAKE 1 TABLET BY MOUTH DAILY FOR 21 DAYS 10/01/20 10/01/21  Wayne Sever, DO  ondansetron (ZOFRAN-ODT) 4 MG disintegrating tablet DISSOLVE 1 TABLET BY MOUTH EVERY 4 HOURS AS NEEDED FOR VOMITING 09/22/20 09/22/21  Charlynne Pander, MD  potassium chloride SA (KLOR-CON) 20 MEQ tablet Take 20 mEq by mouth daily. 04/29/20   [provider]  predniSONE (DELTASONE) 20 MG tablet TAKE 1 TABLET BY MOUTH TWICE DAILY FOR 5 DAYS 09/13/20 09/13/21  D'Allura, Sal A., DO  rosuvastatin (CRESTOR) 10 MG tablet Take 10 mg by mouth daily. 04/29/20   [provider]  traMADol (ULTRAM) 50 MG tablet TAKE 1 TABLET (50 MG TOTAL) BY MOUTH EVERY 6 (SIX) HOURS AS NEEDED FOR UP TO 5 DAYS. 10/15/20 04/13/21  Verlin Dike., MD  traMADol (ULTRAM) 50 MG tablet TAKE 1 TABLET (50 MG TOTAL) BY MOUTH EVERY 12 (TWELVE) HOURS AS NEEDED. 10/08/20 04/06/21  Cathren Laine, MD    Allergies    Codeine  Review of Systems   Review of Systems  Constitutional: Negative for fever.  HENT: Negative for sore throat.   Eyes: Negative for visual disturbance.  Respiratory: Positive for cough. Negative for shortness of breath.   Cardiovascular: Negative for chest pain.  Gastrointestinal: Positive for nausea and vomiting. Negative for abdominal pain.  Genitourinary: Negative for dysuria and frequency.  Musculoskeletal: Positive for back pain. Negative for falls.  Skin: Negative for rash.  Neurological: Positive for weakness. Negative for headaches.    Physical Exam Updated Vital Signs BP 140/81    Pulse 60   Temp 98 F (36.7 C) (Oral)   Resp 14   Ht 4\' 11"  (1.499 m)   Wt 48 kg   SpO2 100%   BMI 21.37 kg/m   Physical Exam Vitals and nursing note reviewed.  Constitutional:      General: She is not in acute distress.    Appearance: Normal appearance. She is well-developed.  HENT:     Head: Normocephalic and atraumatic.  Eyes:     Conjunctiva/sclera: Conjunctivae normal.  Cardiovascular:     Rate and Rhythm: Normal rate and regular rhythm.     Heart sounds: No murmur heard.   Pulmonary:     Effort: Pulmonary effort is normal. No respiratory distress.  Breath sounds: Normal breath sounds.  Abdominal:     Palpations: Abdomen is soft.     Tenderness: There is no abdominal tenderness. There is no guarding or rebound.  Musculoskeletal:        General: No deformity or signs of injury. Normal range of motion.     Cervical back: Neck supple.  Skin:    General: Skin is warm and dry.  Neurological:     General: No focal deficit present.     Mental Status: She is alert.     ED Results / Procedures / Treatments   Labs (all labs ordered are listed, but only abnormal results are displayed) Labs Reviewed  CBC WITH DIFFERENTIAL/PLATELET - Abnormal; Notable for the following components:      Result Value   RBC 3.64 (*)    Hemoglobin 11.6 (*)    MCV 101.1 (*)    All other components within normal limits  COMPREHENSIVE METABOLIC PANEL - Abnormal; Notable for the following components:   Potassium 5.3 (*)    Glucose, Bld 115 (*)    BUN 44 (*)    Creatinine, Ser 2.10 (*)    GFR, Estimated 21 (*)    All other components within normal limits  CBG MONITORING, ED - Abnormal; Notable for the following components:   Glucose-Capillary 104 (*)    All other components within normal limits  SARS CORONAVIRUS 2 (TAT 6-24 HRS)  URINALYSIS, ROUTINE W REFLEX MICROSCOPIC  AMMONIA    EKG EKG Interpretation  Date/Time:  Monday Jan 20 2021 12:34:39 EDT Ventricular Rate:  60 PR  Interval:  166 QRS Duration: 156 QT Interval:  487 QTC Calculation: 487 R Axis:   -66 Text Interpretation: Sinus rhythm Left bundle branch block No old tracing to compare Confirmed by Meridee Score [phone]) on 01/20/2021 2:25:21 PM   Radiology CT Head Wo Contrast  Result Date: 01/20/2021 CLINICAL DATA:  Mental status change.  Weakness. EXAM: CT HEAD WITHOUT CONTRAST TECHNIQUE: Contiguous axial images were obtained from the base of the skull through the vertex without intravenous contrast. COMPARISON:  12/29/2020 FINDINGS: Brain: There is no evidence of an acute infarct, intracranial hemorrhage, mass, midline shift, or extra-axial fluid collection. Cerebral atrophy is within normal limits for age. Hypodensities in the cerebral white matter bilaterally are unchanged and nonspecific but compatible with mild chronic small vessel ischemic disease. A chronic lacunar infarct is again seen involving the posterior limb of the right internal capsule/lateral right thalamus. Vascular: Calcified atherosclerosis at the skull base. No hyperdense vessel. Skull: No fracture or suspicious osseous lesion. Sinuses/Orbits: Visualized paranasal sinuses and mastoid air cells are clear. Bilateral cataract extraction. Other: None. IMPRESSION: 1. No evidence of acute intracranial abnormality. 2. Mild chronic small vessel ischemic disease. Electronically Signed   By: Sebastian Ache M.D.   On: 01/20/2021 12:32   DG Chest Port 1 View  Result Date: 01/20/2021 CLINICAL DATA:  Weakness.  Altered mental status.  Cough. EXAM: PORTABLE CHEST 1 VIEW COMPARISON:  12/28/2020 FINDINGS: The patient is slightly rotated to the right with unchanged cardiomediastinal silhouette. Aortic atherosclerosis is noted. A dual lead pacemaker remains in place. No confluent airspace opacity, edema, pleural effusion, pneumothorax is identified. An old, incompletely healed proximal left humerus fracture and advanced right glenohumeral osteoarthrosis are  noted. IMPRESSION: No active disease. Electronically Signed   By: Sebastian Ache M.D.   On: 01/20/2021 12:35    Procedures Procedures   Medications Ordered in ED Medications  sodium chloride 0.9 % bolus 500  mL (0 mLs Intravenous Stopped 01/20/21 1355)  ondansetron (ZOFRAN) injection 4 mg (4 mg Intravenous Given 01/20/21 1231)    ED Course  I have reviewed the triage vital signs and the nursing notes.  Pertinent labs & imaging results that were available during my care of the patient were reviewed by me and considered in my medical decision making (see chart for details).  Clinical Course as of 01/20/21 1518  Mon Jan 20, 2021  1457 Patient's daughter is here now.  She said over the last few days she has been declining in being too weak to even use her walker.  She is not sure what the decline is from.  I reviewed her labs with her.  She said they have held her meloxicam because of possible kidney injury. [MB]  1516 Discussed with the transfer line for awake.  Currently [facility] does not have any open beds but they will put her on the list and be in touch with [place]. [MB]    Clinical Course User Index [MB] Terrilee Files, MD   MDM Rules/Calculators/A&P                         This patient complains of generalized weakness cough few episodes of vomiting; this involves an extensive number of treatment Options and is a complaint that carries with it a high risk of complications and Morbidity. The differential includes UTI, dehydration, pneumonia, COVID, metabolic derangement, anemia  I ordered, reviewed and interpreted labs, which included CBC with normal white count, stable hemoglobin, chemistries with mildly elevated potassium and elevated BUN and creatinine normal LFTs, urinalysis unremarkable, ammonia normal I ordered medication IV fluids and nausea medication I ordered imaging studies which included chest x-ray and head CT and I independently    visualized and interpreted imaging  which showed no acute findings Additional history obtained from patient's granddaughter and daughter Previous records obtained and reviewed in epic I consulted [facility] acceptance line and discussed lab and imaging findings  Critical Interventions: None  After the interventions stated above, I reevaluated the patient and found patient to be somewhat more alert and interactive.  Daughter is concerned she will do well at home.  They would like to be admitted to [facility].  I talk with [facility] acceptance line currently they have no allergy to accept her.  They will put her on the list and let us know.   Final Clinical Impression(s) / ED Diagnoses Final diagnoses:  Weakness  AKI (acute kidney injury) [facility])    Rx / DC Orders ED Discharge Orders    None       Terrilee Files, MD 01/20/21 1746

## 2021-01-20 NOTE — ED Notes (Signed)
All meds reviewed with dtg, all meds reconciled

## 2021-01-20 NOTE — ED Notes (Signed)
PT AWAITING  BED AT [facility], NO BEDS AVAILABLE AT THIS TIME

## 2021-01-20 NOTE — ED Notes (Signed)
Food offered to pt, dtg states she had bought her a sandwich and she ate the cheese and the bread.  Apple juice given to pt.

## 2021-01-20 NOTE — ED Notes (Signed)
Message sent to pharmacy to approve crestor, not available in pyxis

## 2021-01-20 NOTE — ED Provider Notes (Signed)
Blood pressure (!) 174/73, pulse 86, temperature 98 F (36.7 C), temperature source Oral, resp. rate 16, height 4\' 11"  (1.499 m), weight 48 kg, SpO2 98 %.  Assuming care from Dr. .  In short, Cassandra Patel is a 87 y.o. female with a chief complaint of Weakness .  Refer to the original H&P for additional details.  08:30 PM  Call the [facility] transfer line for an update regarding bed status for this patient.  They do not have any bed availability.  Patient remains on the wait list.  They are unsure when they will get a bed.  I spoke with the patient's daughter and the patient regarding this fact and other options such as being admitted to [facility].  After some discussion they agree to this.  Will discuss with the hospitalist.    EKG Interpretation  Date/Time:  Monday Jan 20 2021 12:34:39 EDT Ventricular Rate:  60 PR Interval:  166 QRS Duration: 156 QT Interval:  487 QTC Calculation: 487 R Axis:   -66 Text Interpretation: Sinus rhythm Left bundle branch block No old tracing to compare Confirmed by [id number] [phone]) on 01/20/2021 2:25:21 PM       Discussed patient's case with [facility] to request admission. Patient and family (if present) updated with plan. Care transferred to [facility] service.  I reviewed all nursing notes, vitals, pertinent old records, EKGs, labs, imaging (as available).     [facility], MD 01/20/21 2116

## 2021-01-21 ENCOUNTER — Encounter (HOSPITAL_COMMUNITY): Payer: Self-pay | Admitting: Internal Medicine

## 2021-01-21 ENCOUNTER — Other Ambulatory Visit (HOSPITAL_BASED_OUTPATIENT_CLINIC_OR_DEPARTMENT_OTHER): Payer: Self-pay

## 2021-01-21 DIAGNOSIS — F0390 Unspecified dementia without behavioral disturbance: Secondary | ICD-10-CM | POA: Diagnosis present

## 2021-01-21 DIAGNOSIS — Z20822 Contact with and (suspected) exposure to covid-19: Secondary | ICD-10-CM | POA: Diagnosis present

## 2021-01-21 DIAGNOSIS — Z7982 Long term (current) use of aspirin: Secondary | ICD-10-CM | POA: Diagnosis not present

## 2021-01-21 DIAGNOSIS — I10 Essential (primary) hypertension: Secondary | ICD-10-CM

## 2021-01-21 DIAGNOSIS — R5381 Other malaise: Secondary | ICD-10-CM | POA: Diagnosis present

## 2021-01-21 DIAGNOSIS — E7800 Pure hypercholesterolemia, unspecified: Secondary | ICD-10-CM | POA: Diagnosis present

## 2021-01-21 DIAGNOSIS — N179 Acute kidney failure, unspecified: Secondary | ICD-10-CM | POA: Diagnosis present

## 2021-01-21 DIAGNOSIS — I5032 Chronic diastolic (congestive) heart failure: Secondary | ICD-10-CM | POA: Diagnosis present

## 2021-01-21 DIAGNOSIS — I509 Heart failure, unspecified: Secondary | ICD-10-CM

## 2021-01-21 DIAGNOSIS — E860 Dehydration: Secondary | ICD-10-CM | POA: Diagnosis present

## 2021-01-21 DIAGNOSIS — Z7902 Long term (current) use of antithrombotics/antiplatelets: Secondary | ICD-10-CM | POA: Diagnosis not present

## 2021-01-21 DIAGNOSIS — R531 Weakness: Secondary | ICD-10-CM | POA: Diagnosis present

## 2021-01-21 DIAGNOSIS — I130 Hypertensive heart and chronic kidney disease with heart failure and stage 1 through stage 4 chronic kidney disease, or unspecified chronic kidney disease: Secondary | ICD-10-CM | POA: Diagnosis present

## 2021-01-21 DIAGNOSIS — Z90710 Acquired absence of both cervix and uterus: Secondary | ICD-10-CM | POA: Diagnosis not present

## 2021-01-21 DIAGNOSIS — Z79899 Other long term (current) drug therapy: Secondary | ICD-10-CM | POA: Diagnosis not present

## 2021-01-21 DIAGNOSIS — N1832 Chronic kidney disease, stage 3b: Secondary | ICD-10-CM | POA: Diagnosis present

## 2021-01-21 LAB — BASIC METABOLIC PANEL
Anion gap: 6 (ref 5–15)
BUN: 35 mg/dL — ABNORMAL HIGH (ref 8–23)
CO2: 26 mmol/L (ref 22–32)
Calcium: 10.4 mg/dL — ABNORMAL HIGH (ref 8.9–10.3)
Chloride: 110 mmol/L (ref 98–111)
Creatinine, Ser: 1.39 mg/dL — ABNORMAL HIGH (ref 0.44–1.00)
GFR, Estimated: 35 mL/min — ABNORMAL LOW (ref >60)
Glucose, Bld: 86 mg/dL (ref 70–99)
Potassium: 4.9 mmol/L (ref 3.5–5.1)
Sodium: 142 mmol/L (ref 135–145)

## 2021-01-21 LAB — CBC
HCT: 37.7 % (ref 36.0–46.0)
Hemoglobin: 11.8 g/dL — ABNORMAL LOW (ref 12.0–15.0)
MCH: 31.1 pg (ref 26.0–34.0)
MCHC: 31.3 g/dL (ref 30.0–36.0)
MCV: 99.2 fL (ref 80.0–100.0)
Platelets: 167 10*3/uL (ref 150–400)
RBC: 3.8 MIL/uL — ABNORMAL LOW (ref 3.87–5.11)
RDW: 12.8 % (ref 11.5–15.5)
WBC: 3.7 10*3/uL — ABNORMAL LOW (ref 4.0–10.5)
nRBC: 0 % (ref 0.0–0.2)

## 2021-01-21 MED ORDER — ACETAMINOPHEN 650 MG RE SUPP: 650.0000 mg | Freq: Four times a day (QID) | RECTAL | Status: DC | PRN

## 2021-01-21 MED ORDER — HEPARIN SODIUM (PORCINE) 5000 UNIT/ML IJ SOLN
5000.0000 [IU] | Freq: Three times a day (TID) | INTRAMUSCULAR | Status: DC
  Administered 2021-01-21 – 2021-01-22 (×4): 5000 [IU] via SUBCUTANEOUS
  Filled 2021-01-21 (×3): qty 1

## 2021-01-21 MED ORDER — ACETAMINOPHEN 325 MG PO TABS: 650.0000 mg | ORAL_TABLET | Freq: Four times a day (QID) | ORAL | Status: DC | PRN

## 2021-01-21 NOTE — Evaluation (Signed)
Occupational Therapy Evaluation Patient Details Name: Cassandra Patel MRN: [id number] DOB: 1921/09/26 Today's Date: 01/21/2021    History of Present Illness PT is a 87 yo female admitted for c/o weakness and unsteady gait per family and pt. Pt found to be dehydrated with AKI.  PMH: pacemaker, CHF, HTN, dementia, OA.   Clinical Impression   Pt admitted with the above diagnosis and has the mild deficits listed below. Pt would benefit from cont OT to increase safety and independence with basic adls and adl transfers so she can d/c home safely with her daughter.  Pt lives with daughter and granddaughter who are always there to assist pt.  Feel pt is very close to her baseline and can return home with them with HHOT.  Pt may reach level during this stay if longer than a couple of days that she may not need HHOT.  Pt did not require more than min assist for any adls and feels stronger than when admitted. Will continue to treat and focus on safety and fall prevention during adls.     Follow Up Recommendations  Home health OT;Supervision/Assistance - 24 hour    Equipment Recommendations  None recommended by OT    Recommendations for Other Services       Precautions / Restrictions Precautions Precautions: Fall Precaution Comments: Pt has not fallen since she fell and broke L shoulder months ago.  No surgery done. Restrictions Weight Bearing Restrictions: No      Mobility Bed Mobility Overal bed mobility: Needs Assistance Bed Mobility: Sidelying to Sit   Sidelying to sit: Supervision;HOB elevated       General bed mobility comments: Pt came to edge of bed with rail use only and HOB at 30.  Pt was able to scoot to EOB without assist as she is only 4'11"    Transfers Overall transfer level: Needs assistance Equipment used: Rolling walker (2 wheeled) Transfers: Sit to/from [employer] Sit to Stand: Min guard Stand pivot transfers: Min guard       General transfer  comment: Cues for hand placement. It appears pt pushes up from her walker at home.  Pt never uses walker without assist at home.    Balance Overall balance assessment: Mild deficits observed, not formally tested                                         ADL either performed or assessed with clinical judgement   ADL Overall ADL's : Needs assistance/impaired Eating/Feeding: Set up;Sitting   Grooming: Wash/dry hands;Wash/dry face;Oral care;Min guard;Standing Grooming Details (indicate cue type and reason): Pt stood at sink for 3 minutes. Upper Body Bathing: Set up;Sitting   Lower Body Bathing: Minimal assistance;Sit to/from stand;Cueing for safety   Upper Body Dressing : Set up;Sitting   Lower Body Dressing: Minimal assistance;Sit to/from stand;Cueing for safety Lower Body Dressing Details (indicate cue type and reason): Pt able to donn shoes and tie them without assist. Toilet Transfer: Min guard;BSC;RW;Ambulation Toilet Transfer Details (indicate cue type and reason): Pt walked a few steps to [facility] Toileting- Clothing Manipulation and Hygiene: Min guard;Sit to/from stand;Cueing for safety       Functional mobility during ADLs: Minimal assistance;Rolling walker General ADL Comments: Pt appears to be close to baseline. Pt states she is feeling much better than when she came into the hospital. Pt not requiring more than  min assist with  any adls. Feel she could d/c home if family feels ok with caring for her.     Vision Baseline Vision/History: Wears glasses Wears Glasses: Reading only Patient Visual Report: No change from baseline Vision Assessment?: No apparent visual deficits     Perception Perception Perception Tested?: No   Praxis Praxis Praxis tested?: Within functional limits    Pertinent Vitals/Pain Pain Assessment: Faces Faces Pain Scale: Hurts even more Pain Location: L shoulder Pain Descriptors / Indicators: Aching;Sore Pain Intervention(s):  Monitored during session;Repositioned     Hand Dominance Right   Extremity/Trunk Assessment Upper Extremity Assessment Upper Extremity Assessment: LUE deficits/detail LUE Deficits / Details: Shoulder flextion and abducation limited to 90degrees due to pain.  Pt fell and broke this shoulder months ago. LUE: Unable to fully assess due to pain LUE Sensation: WNL LUE Coordination: decreased gross motor   Lower Extremity Assessment Lower Extremity Assessment: Defer to PT evaluation   Cervical / Trunk Assessment Cervical / Trunk Assessment: Other exceptions Cervical / Trunk Exceptions: Pt reports she has disc degeneration in back.   Communication Communication Communication: No difficulties   Cognition Arousal/Alertness: Awake/alert Behavior During Therapy: WFL for tasks assessed/performed Overall Cognitive Status: History of cognitive impairments - at baseline                                 General Comments: Pt is very functional with cognition. Requires a bit more processing time but was A&Ox4 with good LTM.  STM mildly impaired.   General Comments  Pt very independent for age and seems to have a very supportive family.    Exercises     Shoulder Instructions      Home Living Family/patient expects to be discharged to:: Private residence Living Arrangements: Children Available Help at Discharge: Family;Available 24 hours/day Type of Home: House Home Access: Stairs to enter [employer] of Steps: 1 Entrance Stairs-Rails: None Home Layout: Able to live on main level with bedroom/bathroom     Bathroom Shower/Tub: Walk-in shower;Door   [employer] Toilet: Standard Bathroom Accessibility: Yes How Accessible: Accessible via wheelchair Home Equipment: Walker - 2 wheels;Cane - single point;Bedside commode;Toilet riser;Wheelchair - manual   Additional Comments: Pt lives with daugher. Daughter bumps pt up one small threshhold to get into the house so pt  does not have to manage any steps.  Pt lives on main floor.      Prior Functioning/Environment Level of Independence: Needs assistance  Gait / Transfers Assistance Needed: Pt walks only with walker and with someone holding to her with gait belt.  Otherwise pt is mod I with use of w/c. Pt gets up by herself in the morning at 330 am and gets herself in the shower with the use of her w/c. ADL's / Homemaking Assistance Needed: Family does all cooking and cleaning. If not in a hurry, pt can dress self, bathe self, groom and takes w/c to go to the bathroom although does use depends on regular basis.            OT Problem List: Decreased range of motion;Impaired balance (sitting and/or standing);Pain      OT Treatment/Interventions: Self-care/ADL training;Therapeutic activities;Balance training    OT Goals(Current goals can be found in the care plan section) Acute Rehab OT Goals Patient Stated Goal: to go back home and feel better OT Goal Formulation: With patient Time For Goal Achievement: 02/04/21 Potential to Achieve Goals: Good ADL Goals Additional ADL  Goal #1: Pt will walk to shower with walker with supervision and bathe self on 3:1 commode with mod I. Additional ADL Goal #2: Pt will wheel self to toilet(or walk with walker with S if w/c not available) and toilet with mod I. Additional ADL Goal #3: Pt will dress self with mod I given extra time.  OT Frequency: Min 2X/week   Barriers to D/C:    very supportive family with daughter and granddaughter.       Co-evaluation              AM-PAC OT "6 Clicks" Daily Activity     Outcome Measure Help from another person eating meals?: None Help from another person taking care of personal grooming?: None Help from another person toileting, which includes using toliet, bedpan, or urinal?: A Little Help from another person bathing (including washing, rinsing, drying)?: A Little Help from another person to put on and taking off regular  upper body clothing?: None Help from another person to put on and taking off regular lower body clothing?: A Little 6 Click Score: 21   End of Session Equipment Utilized During Treatment: Rolling walker;Gait belt Nurse Communication: Mobility status  Activity Tolerance: Patient tolerated treatment well Patient left: in chair;with call bell/phone within reach  OT Visit Diagnosis: Unsteadiness on feet (R26.81)                Time: 5035-4656 OT Time Calculation (min): 23 min Charges:  OT General Charges $OT Visit: 1 Visit OT Evaluation $OT Eval Low Complexity: 1 Low  Hope Budds 01/21/2021, 9:26 AM

## 2021-01-21 NOTE — Progress Notes (Signed)
Received pt from [place]. Notified Dr. Lewis Moccasin of pts arrival and for admission orders including code status. Informed that pt is on the list.

## 2021-01-21 NOTE — H&P (Signed)
History and Physical    Cassandra Patel UYQ:[id number] DOB: [birth year] DOA: 01/20/2021  PCP: [name], MD   Patient coming from: Home, lives with daughter  Chief Complaint: weakness  HPI: Cassandra Patel is a 87 y.o. female with medical history significant for CHF, HTN, dementia, OA who presented to [facility] ER with complaint of weakness and being unsteady on feet.  Family is with patient at this time.  Daughter reported to the ER physician that patient was having difficulty using her walker to get around which she normally is able to do.  She recently came home from rehab and had been doing well.  She did report feeling cold but did not have any chest pain, palpitations, fever, shortness of breath.  Reportedly patient had an episode of coughing with some posttussive emesis yesterday morning but none since then.  She did not have any loss of consciousness or suffer any falls.  She denies any abdominal pain, hip pain, leg pain.  She has not had any edema of her legs.  Daughter reported ER doctor that patient had been on Mobic for her arthritis but recently stopped it due to concern over its effect on her kidneys.  ED Course: She was found to have acute kidney injury on labs with a creatinine of 2.1 from baseline of 1.2.  She initially was going to go to [facility] for admission where she has been in the past but they did not have any beds available so family accepted bed at [facility].  On arrival patient is hemodynamically stable and has no complaints at this time.  She states that she is feeling better after receiving some IV fluids.  Review of Systems:  General: Reports generalized weakness. Denies fever, chills, weight loss, night sweats.  Denies dizziness.  Denies change in appetite HENT: Denies head trauma, headache, denies change in hearing, tinnitus.  Denies nasal congestion or bleeding.  Denies sore throat, sores in mouth.  Denies difficulty swallowing Eyes: Denies blurry vision, pain  in eye, drainage.  Denies discoloration of eyes. Neck: Denies pain.  Denies swelling.  Denies pain with movement. Cardiovascular: Denies chest pain, palpitations.  Denies edema.  Denies orthopnea Respiratory: Denies shortness of breath, cough. Denies wheezing. Denies sputum production Gastrointestinal: Denies abdominal pain, swelling. Denies nausea, vomiting, diarrhea.  Denies melena.  Denies hematemesis. Musculoskeletal: Denies limitation of movement. Denies deformity or swelling. Denies arthralgias or myalgias. Genitourinary: Denies pelvic pain.  Denies urinary frequency or hesitancy.  Denies dysuria.  Skin: Denies rash.  Denies petechiae, purpura, ecchymosis. Neurological: Denies headache. Denies syncope. Denies seizure activity. Denies weakness or paresthesia.  Denies slurred speech, drooping face. Denies visual change. Psychiatric: Denies depression, anxiety. Denies hallucinations.  Past Medical History:  Diagnosis Date  . Arthritis   . CHF (congestive heart failure) (HCC)   . High cholesterol   . Hypertension   . Pacemaker     Past Surgical History:  Procedure Laterality Date  . ABDOMINAL HYSTERECTOMY    . APPENDECTOMY    . CARDIAC SURGERY      Social History  reports that she has never smoked. She has never used smokeless tobacco. She reports previous alcohol use. She reports previous drug use.  Allergies  Allergen Reactions  . Codeine     History reviewed. No pertinent family history.   Prior to Admission medications   Medication Sig Start Date End Date Taking? Authorizing Provider  acetaminophen-codeine (TYLENOL #3) 300-30 MG tablet Take 1 tablet by mouth every 4 (  four) hours as needed for moderate pain. Takes 2 times a day   Yes [provider]  amLODipine (NORVASC) 2.5 MG tablet TAKE 1 TABLET (2.5 MG TOTAL) BY MOUTH DAILY. 09/22/20 09/22/21 Yes Charlynne PanderYao, David Hsienta, MD  amLODipine (NORVASC) 2.5 MG tablet TAKE 1 TABLET (2.5 MG TOTAL) BY MOUTH DAILY. 01/20/21   Yes   aspirin 325 MG tablet Take 325 mg by mouth daily.   Yes [provider]  cloNIDine (CATAPRES) 0.1 MG tablet Take 0.1 mg by mouth as needed. For SBP >160   Yes [provider]  clopidogrel (PLAVIX) 75 MG tablet Take 1 tablet (75 mg total) by mouth daily. Start date: 12/31/2020 Stop date: 01/19/2021 12/30/20  Yes   dextromethorphan-guaiFENesin (MUCINEX DM) 30-600 MG 12hr tablet Take 1 tablet by mouth 2 (two) times daily.   Yes [provider]  diltiazem (CARDIZEM) 120 MG tablet Take 60 mg by mouth in the morning and at bedtime.   Yes [provider]  donepezil (ARICEPT) 10 MG tablet TAKE 1 TABLET (10 MG TOTAL) BY MOUTH NIGHTLY FOR 30 DAYS. 10/10/20 10/10/21 Yes Leimberger, Veneta Pentonavid Neil, PA-C  ferrous sulfate 324 MG TBEC Take 324 mg by mouth every other day.   Yes [provider]  HYDROcodone-acetaminophen (NORCO/VICODIN) 5-325 MG tablet TAKE 1 TABLET BY MOUTH EVERY 6 (SIX) HOURS AS NEEDED FOR UP TO 5 DAYS FOR SEVERE PAIN (7-10). 10/30/20 04/28/21 Yes Iroetugo, Innocent A, MD  isosorbide mononitrate (ISMO) 20 MG tablet Take 20 mg by mouth 2 (two) times daily. 04/29/20  Yes [provider]  losartan (COZAAR) 25 MG tablet Take 25 mg by mouth at bedtime.   Yes [provider]  polyethylene glycol powder (GLYCOLAX/MIRALAX) 17 GM/SCOOP powder Take 1 Container by mouth as needed for moderate constipation.   Yes [provider]  potassium chloride SA (KLOR-CON) 20 MEQ tablet Take 20 mEq by mouth daily. 04/29/20  Yes [provider]  rosuvastatin (CRESTOR) 10 MG tablet Take 10 mg by mouth daily. 04/29/20  Yes [provider]  buprenorphine (BUTRANS) 5 MCG/HR PTWK Place 1 patch onto the skin every 7 days. 01/09/21     COVID-19 mRNA vaccine, Pfizer, 30 MCG/0.3ML injection INJECT AS DIRECTED 08/09/20 08/09/21  Judyann MunsonSnider, Cynthia, MD  diltiazem (CARDIZEM) 120 MG tablet Take by mouth at bedtime. 03/26/20   [provider]   diltiazem (CARDIZEM) 120 MG tablet TAKE 1/2 TABLET BY MOUTH DAILY 07/02/20 07/02/21  Caffie DammeSmith, Karla, MD  doxycycline (VIBRAMYCIN) 100 MG capsule TAKE 1 CAPSULE BY MOUTH TWICE DAILY FOR 7 DAYS Patient taking differently: Take by mouth 2 (two) times daily. for 7 days 08/30/20 08/30/21  Cheryll CockayneHong, Joshua S, MD  furosemide (LASIX) 40 MG tablet Take 40 mg by mouth daily. 03/31/20   [provider]  gabapentin (NEURONTIN) 300 MG capsule TAKE 1 CAPSULE (300 MG TOTAL) BY MOUTH 2 TIMES DAILY FOR 30 DAYS. 10/03/20 10/03/21  Leimberger, Veneta Pentonavid Neil, PA-C  isosorbide mononitrate (ISMO) 20 MG tablet TAKE 1 TABLET BY MOUTH TWICE DAILY 01/08/20 01/07/21    levofloxacin (LEVAQUIN) 750 MG tablet TAKE 1 TABLET (750 MG TOTAL) BY MOUTH DAILY FOR 7 DAYS. 09/19/20 09/19/21  Leimberger, Veneta Pentonavid Neil, PA-C  meloxicam (MOBIC) 15 MG tablet TAKE 1 TABLET BY MOUTH DAILY 01/17/21     methylPREDNISolone (MEDROL DOSEPAK) 4 MG TBPK tablet TAKE 6 TABS BY MOUTH ON DAY 1; 5 TABS ON DAY 2; 4 TABS ON DAY 3; 3 TABS ON DAY 4; 2 TABS ON DAY 5; 1  TAB ON DAY 6 THEN STOP 11/14/20 11/14/21  Verlin Dike., MD  moxifloxacin (AVELOX) 400 MG tablet TAKE 1 TABLET BY MOUTH DAILY FOR 21 DAYS 10/01/20 10/01/21  Wayne Sever, DO  ondansetron (ZOFRAN-ODT) 4 MG disintegrating tablet DISSOLVE 1 TABLET BY MOUTH EVERY 4 HOURS AS NEEDED FOR VOMITING 09/22/20 09/22/21  Charlynne Pander, MD  predniSONE (DELTASONE) 20 MG tablet TAKE 1 TABLET BY MOUTH TWICE DAILY FOR 5 DAYS Patient taking differently: Take by mouth 2 (two) times daily. for 5 days 09/13/20 09/13/21  D'Allura, Sal A., DO  traMADol (ULTRAM) 50 MG tablet TAKE 1 TABLET (50 MG TOTAL) BY MOUTH EVERY 6 (SIX) HOURS AS NEEDED FOR UP TO 5 DAYS. 10/15/20 04/13/21  Verlin Dike., MD  traMADol (ULTRAM) 50 MG tablet TAKE 1 TABLET (50 MG TOTAL) BY MOUTH EVERY 12 (TWELVE) HOURS AS NEEDED. 10/08/20 04/06/21  Cathren Laine, MD    Physical Exam: Vitals:   01/20/21 2106 01/20/21 2130 01/21/21 0000 01/21/21 0048  BP: (!)  118/99 126/66 (!) 156/51 (!) 168/104  Pulse: 64 60 65 75  Resp: 13 19 16 20   Temp:    98.1 F (36.7 C)  TempSrc:      SpO2: 100% 98% 98% 100%  Weight:      Height:        Constitutional: NAD, calm, comfortable Vitals:   01/20/21 2106 01/20/21 2130 01/21/21 0000 01/21/21 0048  BP: (!) 118/99 126/66 (!) 156/51 (!) 168/104  Pulse: 64 60 65 75  Resp: 13 19 16 20   Temp:    98.1 F (36.7 C)  TempSrc:      SpO2: 100% 98% 98% 100%  Weight:      Height:       General: WDWN, Alert and oriented person and place.  Eyes: EOMI, PERRL, conjunctivae normal.  Sclera nonicteric HENT:  [place]/AT, external ears normal.  Nares patent without epistasis.  Mucous membranes are moist.  Neck: Soft, normal range of motion, supple, no masses, no thyromegaly.  Trachea midline Respiratory: clear to auscultation bilaterally, no wheezing, no crackles. Normal respiratory effort. No accessory muscle use.  Cardiovascular: Regular rate and rhythm, no murmurs / rubs / gallops. No extremity edema. 1+ pedal pulses.  Abdomen: Soft, no tenderness, nondistended, no rebound or guarding.  No masses palpated. Normal bowel sounds  Musculoskeletal: FROM. no cyanosis. No joint deformity upper and lower extremities. Normal muscle tone.  Skin: Warm, dry, intact no rashes, lesions, ulcers. No induration Neurologic: CN 2-12 grossly intact.  Normal speech.  Sensation intact, Strength 4/5 in all extremities.   Psychiatric: Normal judgment and insight.  Normal mood.    Labs on Admission: I have personally reviewed following labs and imaging studies  CBC: Recent Labs  Lab 01/20/21 1209  WBC 5.2  NEUTROABS 3.0  HGB 11.6*  HCT 36.8  MCV 101.1*  PLT 170    Basic Metabolic Panel: Recent Labs  Lab 01/20/21 1209  NA 139  K 5.3*  CL 110  CO2 22  GLUCOSE 115*  BUN 44*  CREATININE 2.10*  CALCIUM 10.0    GFR: Estimated Creatinine Clearance: 10.4 mL/min (A) (by C-G formula based on SCr of 2.1 mg/dL (H)).  Liver  Function Tests: Recent Labs  Lab 01/20/21 1209  AST 25  ALT 11  ALKPHOS 83  BILITOT 0.5  PROT 7.2  ALBUMIN 4.1    Urine analysis:    Component Value Date/Time   COLORURINE YELLOW 01/20/2021 1425   APPEARANCEUR CLEAR 01/20/2021 1425  LABSPEC 1.025 01/20/2021 1425   PHURINE 5.5 01/20/2021 1425   GLUCOSEU NEGATIVE 01/20/2021 1425   HGBUR NEGATIVE 01/20/2021 1425   BILIRUBINUR NEGATIVE 01/20/2021 1425   KETONESUR NEGATIVE 01/20/2021 1425   PROTEINUR NEGATIVE 01/20/2021 1425   NITRITE NEGATIVE 01/20/2021 1425   LEUKOCYTESUR NEGATIVE 01/20/2021 1425    Radiological Exams on Admission: CT Head Wo Contrast  Result Date: 01/20/2021 CLINICAL DATA:  Mental status change.  Weakness. EXAM: CT HEAD WITHOUT CONTRAST TECHNIQUE: Contiguous axial images were obtained from the base of the skull through the vertex without intravenous contrast. COMPARISON:  12/29/2020 FINDINGS: Brain: There is no evidence of an acute infarct, intracranial hemorrhage, mass, midline shift, or extra-axial fluid collection. Cerebral atrophy is within normal limits for age. Hypodensities in the cerebral white matter bilaterally are unchanged and nonspecific but compatible with mild chronic small vessel ischemic disease. A chronic lacunar infarct is again seen involving the posterior limb of the right internal capsule/lateral right thalamus. Vascular: Calcified atherosclerosis at the skull base. No hyperdense vessel. Skull: No fracture or suspicious osseous lesion. Sinuses/Orbits: Visualized paranasal sinuses and mastoid air cells are clear. Bilateral cataract extraction. Other: None. IMPRESSION: 1. No evidence of acute intracranial abnormality. 2. Mild chronic small vessel ischemic disease. Electronically Signed   By: Sebastian Ache M.D.   On: 01/20/2021 12:32   DG Chest Port 1 View  Result Date: 01/20/2021 CLINICAL DATA:  Weakness.  Altered mental status.  Cough. EXAM: PORTABLE CHEST 1 VIEW COMPARISON:  12/28/2020 FINDINGS:  The patient is slightly rotated to the right with unchanged cardiomediastinal silhouette. Aortic atherosclerosis is noted. A dual lead pacemaker remains in place. No confluent airspace opacity, edema, pleural effusion, pneumothorax is identified. An old, incompletely healed proximal left humerus fracture and advanced right glenohumeral osteoarthrosis are noted. IMPRESSION: No active disease. Electronically Signed   By: Sebastian Ache M.D.   On: 01/20/2021 12:35    EKG: Independently reviewed.  Shows normal sinus rhythm with left bundle branch block.  QTc mildly prolonged at 487  Assessment/Plan Principal Problem:   Acute kidney injury  Ms. Earlywine has elevated renal function on labs with creatinine of 2.1 from baseline of 1.2. Appears to be prerenal.  Gentle IVF hydration with LR of 75 ml/hr overnight Recheck renal function and electrolytes in morning on labs.  Active Problems:   Essential hypertension Continue home medications of cardizem, isosorbide. Monitor BP    Chronic CHF  Monitor I&Os and volume status.     Weakness Consult PT for evaluation in am. Placed on fall precautions.     Dementia  Chronic. Continue aricept.      DVT prophylaxis: Padua score elevated.  Heparin for DVT prophylaxis Code Status:   Full Code.  To be discussed with patient and family when they are here in the morning as patient does not know if she has a living will or not Family Communication:  Diagnosis and plan discussed with patient.  Further recommendations to follow as clinically indicated Disposition Plan:   Patient is from:  Home.  Lives with her daughter  Anticipated DC to:  Home with  Anticipated DC date:  Anticipate less than 2 midnight stay in the hospital  Anticipated DC barriers: To discharge identified at this time  Admission status:  Observation   Carlton Adam MD [facility]  How to contact the [facility] Attending or Consulting provider 7A - 7P or covering provider during after  hours 7P -7A, for this patient?   1. Check the care  team in [facility] and look for a) attending/consulting TRH provider listed and b) the [facility] team listed 2. Log into [email or web address] and use [place] universal password to access. If you do not have the password, please contact the hospital operator. 3. Locate the [facility] provider you are looking for under [facility] and page to a number that you can be directly reached. 4. If you still have difficulty reaching the provider, please page the [facility] (Director on Call) for the Hospitalists listed on amion for assistance.  01/21/2021, 3:16 AM

## 2021-01-21 NOTE — Evaluation (Signed)
Physical Therapy Evaluation Patient Details Name: Cassandra Patel MRN: [id number] DOB: Oct 10, 1921 Today's Date: 01/21/2021   History of Present Illness  PT is a 87 yo female admitted for c/o weakness and unsteady gait per family and pt. Pt found to be dehydrated with AKI.  PMH: pacemaker, CHF, HTN, dementia, OA.  Clinical Impression  Pt admitted with above diagnosis.  Pt currently with functional limitations due to the deficits listed below (see PT Problem List). Pt will benefit from skilled PT to increase their independence and safety with mobility to allow discharge to the venue listed below.  Pt very pleasant and eager to mobilize.  Pt appears to have good safety awareness.  Pt uses w/c independently at baseline and only ambulates with assist and gait belt.  Pt reports having HHPT prior to admission and would like to resume.       Follow Up Recommendations Home health PT (pt states she was already receiving [facility] services)    Equipment Recommendations  None recommended by PT    Recommendations for Other Services       Precautions / Restrictions Precautions Precautions: Fall Precaution Comments: Pt has not fallen since she fell and broke L shoulder months ago.  No surgery done. Restrictions Weight Bearing Restrictions: No      Mobility  Bed Mobility Overal bed mobility: Needs Assistance Bed Mobility: Sidelying to Sit   Sidelying to sit: Supervision;HOB elevated       General bed mobility comments: pt sitting in recliner with sneakers on arrival    Transfers Overall transfer level: Needs assistance Equipment used: Rolling walker (2 wheeled) Transfers: Sit to/from Stand Sit to Stand: Min guard Stand pivot transfers: Min guard       General transfer comment: Cues for hand placement. It appears pt pushes up from her walker at home.  Pt never uses walker without assist at home.  Ambulation/Gait Ambulation/Gait assistance: Min assist Gait Distance (Feet): 70 Feet Assistive  device: Rolling walker (2 wheeled) Gait Pattern/deviations: Step-through pattern;Decreased stride length;Narrow base of support     General Gait Details: very narrow BOS and short steps, pt mildy unsteady requiring min assist at times, pt ambulated distance to tolerance; pt reports she always has assist and gait belt with ambulating at home  Stairs            Wheelchair Mobility    Modified Rankin (Stroke Patients Only)       Balance Overall balance assessment: Mild deficits observed, not formally tested;Needs assistance         Standing balance support: Bilateral upper extremity supported Standing balance-Leahy Scale: Poor Standing balance comment: reliant on UE support                             Pertinent Vitals/Pain Pain Assessment: Faces Faces Pain Scale: Hurts little more Pain Location: right foot arch with ambulating Pain Descriptors / Indicators: Sore Pain Intervention(s): Monitored during session    Home Living Family/patient expects to be discharged to:: Private residence Living Arrangements: Children Available Help at Discharge: Family;Available 24 hours/day Type of Home: House Home Access: Stairs to enter Entrance Stairs-Rails: None Entrance Stairs-Number of Steps: 1 Home Layout: Able to live on main level with bedroom/bathroom Home Equipment: Walker - 2 wheels;Cane - single point;Bedside commode;Toilet riser;Wheelchair - manual Additional Comments: Pt lives with daugher. Daughter bumps pt up one small threshhold to get into the house so pt does not have to manage any steps.  Pt lives on main floor.    Prior Function Level of Independence: Needs assistance   Gait / Transfers Assistance Needed: Pt walks only with walker and with someone holding to her with gait belt.  Otherwise pt is mod I with use of w/c. Pt gets up by herself in the morning at 330 am and gets herself in the shower with the use of her w/c.  ADL's / Homemaking Assistance  Needed: Family does all cooking and cleaning. If not in a hurry, pt can dress self, bathe self, groom and takes w/c to go to the bathroom although does use depends on regular basis.        Hand Dominance   Dominant Hand: Right    Extremity/Trunk Assessment   Upper Extremity Assessment Upper Extremity Assessment: LUE deficits/detail LUE Deficits / Details: Shoulder flextion and abducation limited to 90degrees due to pain.  Pt fell and broke this shoulder months ago. LUE: Unable to fully assess due to pain LUE Sensation: WNL LUE Coordination: decreased gross motor    Lower Extremity Assessment Lower Extremity Assessment: Overall WFL for tasks assessed    Cervical / Trunk Assessment Cervical / Trunk Assessment: Other exceptions Cervical / Trunk Exceptions: Pt reports she has disc degeneration in back.  Communication   Communication: No difficulties  Cognition Arousal/Alertness: Awake/alert Behavior During Therapy: WFL for tasks assessed/performed Overall Cognitive Status: History of cognitive impairments - at baseline                                 General Comments: Pt is very functional with cognition. Requires a bit more processing time but was A&Ox4 with good LTM.  STM mildly impaired.      General Comments General comments (skin integrity, edema, etc.): Pt very independent for age and seems to have a very supportive family.    Exercises     Assessment/Plan    PT Assessment Patient needs continued PT services  PT Problem List Decreased strength;Decreased mobility;Decreased activity tolerance;Decreased knowledge of use of DME;Decreased balance       PT Treatment Interventions DME instruction;Gait training;Therapeutic exercise;Functional mobility training;Therapeutic activities;Patient/family education;Balance training    PT Goals (Current goals can be found in the Care Plan section)  Acute Rehab PT Goals Patient Stated Goal: to go back home and feel  better PT Goal Formulation: With patient Time For Goal Achievement: 01/29/21 Potential to Achieve Goals: Good    Frequency Min 3X/week   Barriers to discharge        Co-evaluation               AM-PAC PT "6 Clicks" Mobility  Outcome Measure Help needed turning from your back to your side while in a flat bed without using bedrails?: A Little Help needed moving from lying on your back to sitting on the side of a flat bed without using bedrails?: A Little Help needed moving to and from a bed to a chair (including a wheelchair)?: A Little Help needed standing up from a chair using your arms (e.g., wheelchair or bedside chair)?: A Little Help needed to walk in hospital room?: A Lot Help needed climbing 3-5 steps with a railing? : A Lot 6 Click Score: 16    End of Session Equipment Utilized During Treatment: Gait belt Activity Tolerance: Patient tolerated treatment well Patient left: in chair;with call bell/phone within reach Nurse Communication: Mobility status PT Visit Diagnosis: Other abnormalities of gait  and mobility (R26.89)    Time: 7322-0254 PT Time Calculation (min) (ACUTE ONLY): 20 min   Charges:   PT Evaluation $PT Eval Low Complexity: 1 Low        Kati PT, DPT Acute Rehabilitation Services Pager: [phone] Office: [phone]  Sarajane Jews 01/21/2021, 12:24 PM

## 2021-01-21 NOTE — Progress Notes (Signed)
Cassandra Patel is a 87 y.o. female with medical history significant for CHF, HTN, dementia, OA who presented to [facility] ER with complaint of weakness and being unsteady on feet.  Family is with patient at this time.  Daughter reported to the ER physician that patient was having difficulty using her walker to get around which she normally is able to do.  She recently came home from rehab and had been doing well.  She did report feeling cold but did not have any chest pain, palpitations, fever, shortness of breath.  Reportedly patient had an episode of coughing with some posttussive emesis yesterday morning but none since then.  She did not have any loss of consciousness or suffer any falls.  She denies any abdominal pain, hip pain, leg pain.  She has not had any edema of her legs.  Daughter reported ER doctor that patient had been on Mobic for her arthritis but recently stopped it due to concern over its effect on her kidneys.  ED Course: She was found to have acute kidney injury on labs with a creatinine of 2.1 from baseline of 1.2.  She initially was going to go to [facility] for admission where she has been in the past but they did not have any beds available so family accepted bed at [facility].  On arrival patient is hemodynamically stable and has no complaints at this time.  She states that she is feeling better after receiving some IV fluids.  01/21/21: Patient was seen and examined at her bedside.  Reports she doesn't like to drink water.  Evaluated by PT OT with recs for Home health PT OT.  TOC consulted to assist.  Please refer to H&P dictated by my partner Dr. Rachael Darby on 01/21/21 for further details of the assessment and plan.

## 2021-01-22 ENCOUNTER — Other Ambulatory Visit (HOSPITAL_BASED_OUTPATIENT_CLINIC_OR_DEPARTMENT_OTHER): Payer: Self-pay

## 2021-01-22 DIAGNOSIS — N179 Acute kidney failure, unspecified: Secondary | ICD-10-CM | POA: Diagnosis not present

## 2021-01-22 LAB — BASIC METABOLIC PANEL
Anion gap: 8 (ref 5–15)
BUN: 26 mg/dL — ABNORMAL HIGH (ref 8–23)
CO2: 25 mmol/L (ref 22–32)
Calcium: 10.1 mg/dL (ref 8.9–10.3)
Chloride: 106 mmol/L (ref 98–111)
Creatinine, Ser: 1.05 mg/dL — ABNORMAL HIGH (ref 0.44–1.00)
GFR, Estimated: 48 mL/min — ABNORMAL LOW (ref >60)
Glucose, Bld: 84 mg/dL (ref 70–99)
Potassium: 4 mmol/L (ref 3.5–5.1)
Sodium: 139 mmol/L (ref 135–145)

## 2021-01-22 LAB — MAGNESIUM: Magnesium: 2 mg/dL (ref 1.7–2.4)

## 2021-01-22 LAB — PHOSPHORUS: Phosphorus: 2.8 mg/dL (ref 2.5–4.6)

## 2021-01-22 MED ORDER — POTASSIUM CHLORIDE CRYS ER 20 MEQ PO TBCR
20.0000 meq | EXTENDED_RELEASE_TABLET | ORAL | 0 refills | Status: AC
  Filled 2021-01-22: qty 7, 14d supply, fill #0

## 2021-01-22 NOTE — Discharge Summary (Addendum)
Discharge Summary  Cassandra SartoriusLaura Patel OZH:[id number] DOB: Aug 09, 1922  PCP: Caffie DammeSmith, Karla, MD  Admit date: 01/20/2021 Discharge date: 01/22/2021  Time spent: 35 minutes.  Recommendations for Outpatient Follow-up:  1. Follow-up with your primary care provider within a week. 2. Follow-up with cardiology in 1 to 2 weeks. 3. Take your medications as prescribed. 4. Avoid nephrotoxic agents, NSAIDs. 5. Continue PT OT with assistance and fall precautions. 6. Repeat BMP on Monday, 01/27/2021 at PCPs office.  Discharge Diagnoses:  Active Hospital Problems   Diagnosis Date Noted  . Acute kidney injury (HCC) 01/20/2021  . Weakness 01/21/2021  . Essential hypertension 01/21/2021  . Chronic CHF (HCC) 01/21/2021  . Dementia (HCC) 01/21/2021  . AKI (acute kidney injury) (HCC) 01/21/2021    Resolved Hospital Problems  No resolved problems to display.    Discharge Condition: Stable.  Diet recommendation: Resume p.o. diet.  Vitals:   01/22/21 0437 01/22/21 1100  BP: (!) 186/72 (!) 177/71  Pulse: 62   Resp: 18   Temp: 98.5 F (36.9 C)   SpO2: 100%     History of present illness:  Cassandra BirchwoodLaura Tayloris a 87 y.o.femalewith medical history significant forCHF, HTN, dementia, OA who [facility] with complaint of weakness and being unsteady on her feet.Work-up in the ED revealed AKI with a creatinine of 2.1 from baseline of 1.2. [facility] been in the past but they did not have any beds available so [facility]. On arrival patient is hemodynamically stable and has no complaints. She states that she is feeling better after receiving IV fluids.  Reports she doesn't like to drink water.  During the course of her hospitalization, she was evaluated by PT OT with recs for Home health PT OT.  Patient was receiving home health PT OT and RN prior to admission, TOC assisted at resuming home health at  discharge.  01/22/21: Patient was seen with her daughter at bedside.  Reviewed vital signs and labs results.  Patient is stable for discharge to home.  Recommended that she follows up with her primary care provider and repeat BMP on Monday, 01/27/2021.  Hospital Course:  Principal Problem:   Acute kidney injury (HCC) Active Problems:   Weakness   Essential hypertension   Chronic CHF (HCC)   Dementia (HCC)   AKI (acute kidney injury) (HCC)  Resolved Acute kidney injury on CKD 3B, suspect multifactorial secondary to dehydration versus ARB, NSAIDs. Presented with creatinine of 2.1 from baseline of 1.2.  Back to her baseline creatinine 1.0 with GFR of 48. Improved after receiving IV fluid hydration, holding off home losartan and home meloxicam. Continue to avoid nephrotoxic agents. Repeat BMP on Monday, 01/27/2021. Follow-up with your PCP.  Essential hypertension Continue home medications, Cardizem, isosorbide, Lasix (every other day). Follow-up with her PCP.  Chronic diastolic CHF  Euvolemic on exam Continue cardio medications. Losartan on hold due to recent AKI.  Generalized weakness/physical debility/ambulatory dysfunction Seen by PT OT with recommendation for home health PT OT.   Continue fall precautions.   Avoid dehydration Encourage increase in oral protein calorie intake.   Dementia  Continue aricept.  Fall precautions, reorient as needed. Follow-up with your PCP     Code Status:  Full Code.  Family Communication:  Updated her daughter in person in the room on 01/22/2021.   Procedures:  None.  Consultations:  None.  Discharge Exam: BP (!) 177/71  Pulse 62   Temp 98.5 F (36.9 C)   Resp 18   Ht  (1.499 m)   Wt 48 kg   SpO2 100%   BMI 21.37 kg/m  . General: 87 y.o. year-old female well developed well nourished in no acute distress.  Alert and interactive. . Cardiovascular: Regular rate and rhythm with no rubs or gallops.  No  thyromegaly or JVD noted.   Marland Kitchen Respiratory: Clear to auscultation with no wheezes or rales. Good inspiratory effort. . Abdomen: Soft nontender nondistended with normal bowel sounds x4 quadrants. . Musculoskeletal: No lower extremity edema.  Marland Kitchen Psychiatry: Mood is appropriate for condition and setting  Discharge Instructions You were cared for by a hospitalist during your hospital stay. If you have any questions about your discharge medications or the care you received while you were in the hospital after you are discharged, you can call the unit and asked to speak with the hospitalist on call if the hospitalist that took care of you is not available. Once you are discharged, your primary care physician will handle any further medical issues. Please note that NO REFILLS for any discharge medications will be authorized once you are discharged, as it is imperative that you return to your primary care physician (or establish a relationship with a primary care physician if you do not have one) for your aftercare needs so that they can reassess your need for medications and monitor your lab values.   Allergies as of 01/22/2021      Reactions   Codeine Rash      Medication List    STOP taking these medications   amLODipine 2.5 MG tablet Commonly known as: NORVASC   buprenorphine 5 MCG/HR Ptwk Commonly known as: BUTRANS   doxycycline 100 MG capsule Commonly known as: VIBRAMYCIN   levofloxacin 750 MG tablet Commonly known as: LEVAQUIN   losartan 25 MG tablet Commonly known as: COZAAR   meloxicam 15 MG tablet Commonly known as: MOBIC   methylPREDNISolone 4 MG Tbpk tablet Commonly known as: MEDROL DOSEPAK   moxifloxacin 400 MG tablet Commonly known as: AVELOX   Norco 5-325 MG tablet Generic drug: HYDROcodone-acetaminophen   Pfizer-BioNTech COVID-19 Vacc 30 MCG/0.3ML injection Generic drug: COVID-19 mRNA vaccine (Pfizer)   predniSONE 20 MG tablet Commonly known as: DELTASONE    traMADol 50 MG tablet Commonly known as: ULTRAM     TAKE these medications   acetaminophen 500 MG tablet Commonly known as: TYLENOL Take 500 mg by mouth daily as needed for pain.   aspirin 81 MG EC tablet Take 1 tablet (81 mg total) by mouth daily.   cloNIDine 0.1 MG tablet Commonly known as: CATAPRES Take 0.1 mg by mouth as needed. For SBP >160   clopidogrel 75 MG tablet Commonly known as: PLAVIX Take 1 tablet (75 mg total) by mouth daily. Start date: 12/31/2020 Stop date: 01/19/2021   dextromethorphan-guaiFENesin 30-600 MG 12hr tablet Commonly known as: MUCINEX DM Take 1 tablet by mouth 2 (two) times daily.   diltiazem 120 MG tablet Commonly known as: CARDIZEM TAKE 1/2 TABLET BY MOUTH DAILY What changed:   how much to take  when to take this   donepezil 10 MG tablet Commonly known as: ARICEPT TAKE 1 TABLET (10 MG TOTAL) BY MOUTH NIGHTLY FOR 30 DAYS. What changed:   how much to take  how to take this  when to take this   ferrous sulfate 324 MG Tbec Take 324 mg by mouth every other day.  fluticasone 50 MCG/ACT nasal spray Commonly known as: FLONASE Place 1 spray into both nostrils daily as needed for allergies.   furosemide 40 MG tablet Commonly known as: LASIX Take 40 mg by mouth every other day.   isosorbide mononitrate 20 MG tablet Commonly known as: ISMO Take 20 mg by mouth 2 (two) times daily. What changed: Another medication with the same name was removed. Continue taking this medication, and follow the directions you see here.   ondansetron 4 MG disintegrating tablet Commonly known as: ZOFRAN-ODT DISSOLVE 1 TABLET BY MOUTH EVERY 4 HOURS AS NEEDED FOR VOMITING   polyethylene glycol powder 17 GM/SCOOP powder Commonly known as: GLYCOLAX/MIRALAX Take 17 g by mouth daily as needed for moderate constipation.   potassium chloride SA 20 MEQ tablet Commonly known as: KLOR-CON Take 1 tablet (20 mEq total) by mouth every other day for 14 days. What  changed: when to take this   rosuvastatin 10 MG tablet Commonly known as: CRESTOR Take 10 mg by mouth daily.      Allergies  Allergen Reactions  . Codeine Rash    Follow-up Information    [facility] Follow up.   Why: Your services with them will resume when you return home       [name], MD Follow up in 1 week(s).   Specialty: Family Medicine Contact information: [address] [place] [address] [phone]        Diamond Nickel., MD. Call in 1 day(s).   Specialty: Cardiology Why: Please call for a post hospital follow-up appointment. Contact information: [address] [place] [address] [phone]                The results of significant diagnostics from this hospitalization (including imaging, microbiology, ancillary and laboratory) are listed below for reference.    Significant Diagnostic Studies: CT Head Wo Contrast  Result Date: 01/20/2021 CLINICAL DATA:  Mental status change.  Weakness. EXAM: CT HEAD WITHOUT CONTRAST TECHNIQUE: Contiguous axial images were obtained from the base of the skull through the vertex without intravenous contrast. COMPARISON:  12/29/2020 FINDINGS: Brain: There is no evidence of an acute infarct, intracranial hemorrhage, mass, midline shift, or extra-axial fluid collection. Cerebral atrophy is within normal limits for age. Hypodensities in the cerebral white matter bilaterally are unchanged and nonspecific but compatible with mild chronic small vessel ischemic disease. A chronic lacunar infarct is again seen involving the posterior limb of the right internal capsule/lateral right thalamus. Vascular: Calcified atherosclerosis at the skull base. No hyperdense vessel. Skull: No fracture or suspicious osseous lesion. Sinuses/Orbits: Visualized paranasal sinuses and mastoid air cells are clear. Bilateral cataract extraction. Other: None. IMPRESSION: 1. No evidence of acute intracranial  abnormality. 2. Mild chronic small vessel ischemic disease. Electronically Signed   By: Sebastian Ache M.D.   On: 01/20/2021 12:32   DG Chest Port 1 View  Result Date: 01/20/2021 CLINICAL DATA:  Weakness.  Altered mental status.  Cough. EXAM: PORTABLE CHEST 1 VIEW COMPARISON:  12/28/2020 FINDINGS: The patient is slightly rotated to the right with unchanged cardiomediastinal silhouette. Aortic atherosclerosis is noted. A dual lead pacemaker remains in place. No confluent airspace opacity, edema, pleural effusion, pneumothorax is identified. An old, incompletely healed proximal left humerus fracture and advanced right glenohumeral osteoarthrosis are noted. IMPRESSION: No active disease. Electronically Signed   By: Sebastian Ache M.D.   On: 01/20/2021 12:35    Microbiology: Recent Results (from the past 240 hour(s))  SARS CORONAVIRUS 2 (TAT 6-24 HRS) Nasopharyngeal Nasopharyngeal Swab     Status: None   Collection Time: 01/20/21 12:09 PM   Specimen: Nasopharyngeal Swab  Result Value Ref Range Status   SARS Coronavirus 2 NEGATIVE NEGATIVE Final    Comment: (NOTE) SARS-CoV-2 target nucleic acids are NOT DETECTED.  The SARS-CoV-2 RNA is generally detectable in upper and lower respiratory specimens during the acute phase of infection. Negative results do not preclude SARS-CoV-2 infection, do not rule out co-infections with other pathogens, and should not be used as the sole basis for treatment or other patient management decisions. Negative results must be combined with clinical observations, patient history, and epidemiological information. The expected result is Negative.  Fact Sheet for Patients: HairSlick.no  Fact Sheet for Healthcare Providers: quierodirigir.com  This test is not yet approved or cleared by the [place] FDA and  has been authorized for detection and/or diagnosis of SARS-CoV-2 by FDA under an Emergency Use  Authorization (EUA). This EUA will remain  in effect (meaning this test can be used) for the duration of the COVID-19 declaration under Se ction 564(b)(1) of the Act, 21 U.S.C. section 360bbb-3(b)(1), unless the authorization is terminated or revoked sooner.  Performed at [facility], [address], [place], [place] [address]   Resp Panel by RT-PCR (Flu A&B, Covid) Nasopharyngeal Swab     Status: None   Collection Time: 01/20/21  9:33 PM   Specimen: Nasopharyngeal Swab; Nasopharyngeal(NP) swabs in vial transport medium  Result Value Ref Range Status   SARS Coronavirus 2 by RT PCR NEGATIVE NEGATIVE Final    Comment: (NOTE) SARS-CoV-2 target nucleic acids are NOT DETECTED.  The SARS-CoV-2 RNA is generally detectable in upper respiratory specimens during the acute phase of infection. The lowest concentration of SARS-CoV-2 viral copies this assay can detect is 138 copies/mL. A negative result does not preclude SARS-Cov-2 infection and should not be used as the sole basis for treatment or other patient management decisions. A negative result may occur with  improper specimen collection/handling, submission of specimen other than nasopharyngeal swab, presence of viral mutation(s) within the areas targeted by this assay, and inadequate number of viral copies(<138 copies/mL). A negative result must be combined with clinical observations, patient history, and epidemiological information. The expected result is Negative.  Fact Sheet for Patients:  BloggerCourse.com  Fact Sheet for Healthcare Providers:  SeriousBroker.it  This test is no t yet approved or cleared by the [place] FDA and  has been authorized for detection and/or diagnosis of SARS-CoV-2 by FDA under an Emergency Use Authorization (EUA). This EUA will remain  in effect (meaning this test can be used) for the duration of the COVID-19 declaration under Section  564(b)(1) of the Act, 21 U.S.C.section 360bbb-3(b)(1), unless the authorization is terminated  or revoked sooner.       Influenza A by PCR NEGATIVE NEGATIVE Final   Influenza B by PCR NEGATIVE NEGATIVE Final    Comment: (NOTE) The Xpert Xpress SARS-CoV-2/FLU/RSV plus assay is intended as an aid in the diagnosis of influenza from Nasopharyngeal swab specimens and should not be used as a sole basis for treatment. Nasal washings and aspirates are unacceptable for Xpert Xpress SARS-CoV-2/FLU/RSV testing.  Fact Sheet for Patients: BloggerCourse.com  Fact Sheet for Healthcare Providers: SeriousBroker.it  This test is not yet approved or cleared by the [place] FDA and has been authorized for detection and/or diagnosis of SARS-CoV-2 by FDA under an Emergency Use Authorization (EUA). This EUA will  remain in effect (meaning this test can be used) for the duration of the COVID-19 declaration under Section 564(b)(1) of the Act, 21 U.S.C. section 360bbb-3(b)(1), unless the authorization is terminated or revoked.  Performed at [facility], [address] [place], [place] [address]      Labs: Basic Metabolic Panel: Recent Labs  Lab 01/20/21 1209 01/21/21 0510 01/22/21 0715  NA 139 142 139  K 5.3* 4.9 4.0  CL 110 110 106  CO2 22 26 25   GLUCOSE 115* 86 84  BUN 44* 35* 26*  CREATININE 2.10* 1.39* 1.05*  CALCIUM 10.0 10.4* 10.1  MG  --   --  2.0  PHOS  --   --  2.8   Liver Function Tests: Recent Labs  Lab 01/20/21 1209  AST 25  ALT 11  ALKPHOS 83  BILITOT 0.5  PROT 7.2  ALBUMIN 4.1   No results for input(s): LIPASE, AMYLASE in the last 168 hours. Recent Labs  Lab 01/20/21 1209  AMMONIA 21   CBC: Recent Labs  Lab 01/20/21 1209 01/21/21 0510  WBC 5.2 3.7*  NEUTROABS 3.0  --   HGB 11.6* 11.8*  HCT 36.8 37.7  MCV 101.1* 99.2  PLT 170 167   Cardiac Enzymes: No results for input(s): CKTOTAL,  CKMB, CKMBINDEX, TROPONINI in the last 168 hours. BNP: BNP (last 3 results) No results for input(s): BNP in the last 8760 hours.  ProBNP (last 3 results) No results for input(s): PROBNP in the last 8760 hours.  CBG: Recent Labs  Lab 01/20/21 1224  GLUCAP 104*       Signed:  01/22/21, MD [facility] 01/22/2021, 1:32 PM

## 2021-01-22 NOTE — Discharge Instructions (Signed)
Acute Kidney Injury, Adult  Acute kidney injury is a sudden worsening of kidney function. The kidneys are organs that have several jobs. They filter the blood to remove waste products and extra fluid. They also maintain a healthy balance of minerals and hormones in the body, which helps control blood pressure and keep bones strong. With this condition, your kidneys do not do their jobs as well as they should. This condition ranges from mild to severe. Over time, it may develop into long-lasting (chronic) kidney disease. Early detection and treatment may prevent acute kidney injury from developing into a chronic condition. What are the causes? Common causes of this condition include:  A problem with blood flow to the kidneys. This may be caused by: ? Low blood pressure (hypotension) or shock. ? Blood loss. ? Heart and blood vessel (cardiovascular) disease. ? Severe burns. ? Liver disease.  Direct damage to the kidneys. This may be caused by: ? Certain medicines. ? A kidney infection. ? Poisoning. ? Being around or in contact with toxic substances. ? A surgical wound. ? A hard, direct hit to the kidney area.  A sudden blockage of urine flow. This may be caused by: ? Cancer. ? Kidney stones. ? An enlarged prostate in males. What increases the risk? You are more likely to develop this condition if you:  Are older than age 65.  Are female.  Are hospitalized, especially if you are in critical condition.  Have certain conditions, such as: ? Chronic kidney disease. ? Diabetes. ? Coronary artery disease and heart failure. ? Pulmonary disease. ? Chronic liver disease. What are the signs or symptoms? Symptoms of this condition may not be obvious until the condition becomes severe. Symptoms of this condition can include:  Tiredness (lethargy) or difficulty staying awake.  Nausea or vomiting.  Swelling (edema) of the face, legs, ankles, or feet.  Problems with urination, such  as: ? Pain in the abdomen, or pain along the side of your stomach (flank). ? Producing little or no urine. ? Passing urine with a weak flow.  Muscle twitches and cramps, especially in the legs.  Confusion or trouble concentrating.  Loss of appetite.  Fever. How is this diagnosed? Your health care provider can diagnose this condition based on your symptoms, medical history, and a physical exam.  You may also have other tests, such as:  Blood tests.  Urine tests.  Imaging tests.  A test in which a sample of tissue is removed from the kidneys to be examined under a microscope (kidney biopsy). How is this treated? Treatment for this condition depends on the cause and how severe the condition is. In mild cases, treatment may not be needed. The kidneys may heal on their own. In more severe cases, treatment will involve:  Treating the cause of the kidney injury. This may involve changing any medicines you are taking or adjusting your dosage.  Fluids. You may need specialized IV fluids to balance your body's needs.  Having a catheter placed to drain urine and prevent blockages.  Preventing problems from occurring. This may mean avoiding certain medicines or procedures that can cause further injury to the kidneys. In some cases, treatment may also require:  A procedure to remove toxic wastes from the body (dialysis or continuous renal replacement therapy, CRRT).  Surgery. This may be done to repair a torn kidney or to remove the blockage from the urinary system. Follow these instructions at home: Medicines  Take over-the-counter and prescription medicines only as   told by your health care provider.  Do not take any new medicines without your health care provider's approval. Many medicines can worsen your kidney damage.  Do not take any vitamin and mineral supplements without your health care provider's approval. Many nutritional supplements can worsen your kidney  damage. Lifestyle  If your health care provider prescribed changes to your diet, follow them. You may need to decrease the amount of protein you eat.  Achieve and maintain a healthy weight. If you need help with this, ask your health care provider.  Start or continue an exercise plan. Try to exercise at least 30 minutes a day, 5 days a week.  Do not use any products that contain nicotine or tobacco, such as cigarettes, e-cigarettes, and chewing tobacco. If you need help quitting, ask your health care provider.   General instructions  Keep track of your blood pressure. Report changes in your blood pressure as told by your health care provider.  Stay up to date with your vaccines. Ask your health care provider which vaccines you need.  Keep all follow-up visits as told by your health care provider. This is important.   Where to find more information  American Association of Kidney Patients: ResidentialShow.is  [employer]: www.kidney.org  [employer]: FightingMatch.com.ee  [facility]: ? www.lifeoptions.org ? www.kidneyschool.org Contact a health care provider if:  Your symptoms get worse.  You develop new symptoms. Get help right away if:  You develop symptoms of worsening kidney disease, which include: ? Headaches. ? Abnormally dark or light skin. ? Easy bruising. ? Frequent hiccups. ? Chest pain. ? Shortness of breath. ? End of menstruation in women. ? Seizures. ? Confusion or altered mental status. ? Abdominal or back pain. ? Itchiness.  You have a fever.  Your body is producing less urine.  You have pain or bleeding when you urinate. Summary  Acute kidney injury is a sudden worsening of kidney function.  Acute kidney injury can be caused by problems with blood flow to the kidneys, direct damage to the kidneys, and sudden blockage of urine flow.  Symptoms of this condition may not be obvious until it becomes severe.  Symptoms may include edema, lethargy, confusion, nausea or vomiting, and problems passing urine.  This condition can be diagnosed with blood tests, urine tests, and imaging tests. Sometimes a kidney biopsy is done to diagnose this condition.  Treatment for this condition often involves treating the underlying cause. It is treated with fluids, medicines, diet changes, dialysis, or surgery. This information is not intended to replace advice given to you by your health care provider. Make sure you discuss any questions you have with your health care provider. Document Revised: 06/27/2019 Document Reviewed: 06/27/2019 Elsevier Patient Education  2021 [employer] Inc.   Deconditioning Deconditioning refers to the changes in the body that occur during a period of inactivity. The changes happen in the heart, lungs, and muscles. They make you feel tired and weak (fatigued) and decrease your ability to be active. The three stages of deconditioning include:  Mild deconditioning. This is a change in your ability to do your usual exercise activities, such as running, biking, or swimming.  Moderate deconditioning. This is a change in your ability to do normal everyday activities, such as walking, shopping for groceries, and doing chores.  Severe deconditioning. In this stage, you may not be able to do minimal activity or usual self-care. What are the causes? Deconditioning can occur after only a few  days of inactivity. The longer the period of inactivity, the more severe the deconditioning will be, and the longer it will take to return to your previous level of functioning. Deconditioning is often caused by inactivity due to:  Illnesses, such as cancer, stroke, heart attack, fibromyalgia, and chronic fatigue syndrome.  Injuries, especially back injuries, broken bones, and injuries to soft tissues, such as ligaments and tendons.  A long stay in the hospital.  Pregnancy, especially if long periods of bed  rest are needed. What increases the risk? The following factors may make you more likely to develop this condition:  Staying in the hospital or being on bed rest.  Obesity.  Poor nutrition.  Being an older adult.  Having an injury or illness that affects your movement and activity. What are the signs or symptoms? Symptoms of this condition include:  Weakness and tiredness.  Shortness of breath with minor physical effort (exertion).  A heartbeat that is faster than normal. You may not notice this without taking your pulse.  Pain or discomfort with activity.  Decreased strength, endurance, and balance.  Difficulty doing your usual forms of exercise.  Difficulty doing activities of daily living, such as grocery shopping or chores. You may also have problems walking around the house and doing basic self-care, such as getting to the bathroom, preparing meals, or doing laundry. How is this diagnosed? This condition is diagnosed based on your medical history and a physical exam. During the physical exam, your health care provider will check for signs of deconditioning, such as:  Decreased size of muscles.  Decreased strength.  Trouble with balance.  Shortness of breath or a heart rate that is faster than normal after minor exertion. How is this treated? Treatment for this condition involves an exercise program in which activity is increased slowly. Your health care provider will tell you which exercises are right for you. The exercise program will likely include:  Aerobic exercise. This type of exercise helps improve the functioning of the heart, lungs, and muscles.  Strength training. This type of exercise helps increase muscle size and strength. Both of these types of exercise will improve your endurance. You may be referred to a physical therapist who can create a safe strengthening program for you to follow. Follow these instructions at home: Eating and drinking  Eat a  healthy, well-balanced diet. This includes: ? Proteins, such as lean meats and fish, to build muscles. ? Fresh fruits and vegetables. ? Carbohydrates, such as whole grains, to boost energy.  Drink enough fluid to keep your urine pale yellow.   Activity  Follow the exercise program that is recommended by your health care provider or physical therapist.  Do not increase your exercise any faster than directed.   General instructions  Take over-the-counter and prescription medicines only as told by your health care provider.  Do not use any products that contain nicotine or tobacco, such as cigarettes, e-cigarettes, and chewing tobacco. If you need help quitting, ask your health care provider.  Keep all follow-up visits as told by your health care provider. This is important. Contact a health care provider if:  You are not able to do the recommended exercise program.  You are becoming more and more tired and weak.  You become light-headed when rising to a sitting or standing position.  Your level of endurance decreases after it has improved. Get help right away if you:  Have chest pain.  Are very short of breath.  Have any  episodes of fainting. Summary  Deconditioning refers to the changes in the body that occur during a period of inactivity.  Deconditioning happens in the heart, lungs, and muscles. The changes make you feel tired and weak and decrease your ability to be active.  Treatment for deconditioning involves an exercise program in which activity is increased slowly. This information is not intended to replace advice given to you by your health care provider. Make sure you discuss any questions you have with your health care provider. Document Revised: 01/12/2019 Document Reviewed: 01/12/2019 Elsevier Patient Education  2021 [employer].

## 2021-01-22 NOTE — Plan of Care (Signed)

## 2021-01-22 NOTE — Progress Notes (Signed)
Occupational Therapy Treatment Patient Details Name: Cassandra Patel MRN: [id number] DOB: 01-27-1922 Today's Date: 01/22/2021    History of present illness Pt is a 87 yo female admitted for c/o weakness and unsteady gait per family and pt. Pt found to be dehydrated with AKI.  PMH: pacemaker, CHF, HTN, dementia, OA.   OT comments  Pt making progress with functional goals. Session focused on LB ADLs, functional mobility with RW, transfers to [facility], toileting tasks and ADL mobility safety. Pt hopeful to go home today vs tomorrow. OT will continue to follow acutely to maximize level of function and safety  Follow Up Recommendations  Home health OT;Supervision/Assistance - 24 hour    Equipment Recommendations  None recommended by OT    Recommendations for Other Services      Precautions / Restrictions Precautions Precautions: Fall Precaution Comments: Pt has not fallen since she fell and broke L shoulder months ago.  No surgery done. Restrictions Weight Bearing Restrictions: No       Mobility Bed Mobility Overal bed mobility: Needs Assistance Bed Mobility: Sidelying to Sit   Sidelying to sit: Supervision;HOB elevated       General bed mobility comments: pt sitting in recliner with sneakers donned on arrival    Transfers Overall transfer level: Needs assistance Equipment used: Rolling walker (2 wheeled) Transfers: Sit to/from Stand Sit to Stand: Min guard Stand pivot transfers: Min guard       General transfer comment: Cues for hand placement. It appears pt pushes up from her walker at home.  Pt never uses walker without assist at home.    Balance Overall balance assessment: Mild deficits observed, not formally tested;Needs assistance Sitting-balance support: No upper extremity supported;Feet supported Sitting balance-Leahy Scale: Fair     Standing balance support: Bilateral upper extremity supported;During functional activity Standing balance-Leahy Scale: Poor                              ADL either performed or assessed with clinical judgement   ADL Overall ADL's : Needs assistance/impaired     Grooming: Wash/dry hands;Wash/dry face;Min guard;Standing       Lower Body Bathing: Minimal assistance;Sit to/from stand;Cueing for safety       Lower Body Dressing: Minimal assistance;Sit to/from stand;Cueing for safety Lower Body Dressing Details (indicate cue type and reason): Pt able to donn shoes and tie them without assist. Toilet Transfer: BSC;RW;Ambulation;Minimal assistance;Min guard Toilet Transfer Details (indicate cue type and reason): min A to control RW for turns Toileting- Architect and Hygiene: Min guard;Sit to/from stand;Cueing for safety       Functional mobility during ADLs: Minimal assistance;Min guard;Rolling walker       Vision Baseline Vision/History: Wears glasses Wears Glasses: Reading only Patient Visual Report: No change from baseline     Perception     Praxis      Cognition Arousal/Alertness: Awake/alert Behavior During Therapy: WFL for tasks assessed/performed Overall Cognitive Status: History of cognitive impairments - at baseline Area of Impairment: Memory;Safety/judgement;Problem solving;Awareness                             Problem Solving: Slow processing General Comments: Pt is very functional with cognition. Requires a bit more processing time but was A&Ox4 with good LTM.  STM mildly impaired.        Exercises     Shoulder Instructions  General Comments      Pertinent Vitals/ Pain       Pain Assessment: No/denies pain Pain Score: 0-No pain Faces Pain Scale: Hurts little more Pain Location: left hip (chronic from back per pt) Pain Descriptors / Indicators: Discomfort Pain Intervention(s): Monitored during session;Repositioned  Home Living                                          Prior Functioning/Environment               Frequency  Min 2X/week        Progress Toward Goals  OT Goals(current goals can now be found in the care plan section)  Progress towards OT goals: Progressing toward goals  Acute Rehab OT Goals Patient Stated Goal: go home  Plan Discharge plan remains appropriate    Co-evaluation                 AM-PAC OT "6 Clicks" Daily Activity     Outcome Measure   Help from another person eating meals?: None Help from another person taking care of personal grooming?: None Help from another person toileting, which includes using toliet, bedpan, or urinal?: A Little Help from another person bathing (including washing, rinsing, drying)?: A Little Help from another person to put on and taking off regular upper body clothing?: None Help from another person to put on and taking off regular lower body clothing?: A Little 6 Click Score: 21    End of Session Equipment Utilized During Treatment: Rolling walker;Gait belt;Other (comment) (BSC)  OT Visit Diagnosis: Unsteadiness on feet (R26.81);Other abnormalities of gait and mobility (R26.89);Other symptoms and signs involving cognitive function   Activity Tolerance Patient tolerated treatment well   Patient Left in chair;with call bell/phone within reach   Nurse Communication          Time: 1110-1136 OT Time Calculation (min): 26 min  Charges: OT General Charges $OT Visit: 1 Visit OT Treatments $Self Care/Home Management : 8-22 mins $Therapeutic Activity: 8-22 mins     Galen Manila 01/22/2021, 2:01 PM

## 2021-01-22 NOTE — Progress Notes (Signed)
Physical Therapy Treatment Patient Details Name: Cassandra Patel MRN: [id number] DOB: [birth year] Today's Date: 01/22/2021    History of Present Illness Pt is a 87 yo female admitted for c/o weakness and unsteady gait per family and pt. Pt found to be dehydrated with AKI.  PMH: pacemaker, CHF, HTN, dementia, OA.    PT Comments    Pt assisted to sitting EOB and therapist donned her sneakers.  Pt eager to mobilize and requiring min assist for steadying with ambulation.  Pt anticipates d/c home today.   Follow Up Recommendations  Home health PT;Supervision for mobility/OOB     Equipment Recommendations  None recommended by PT    Recommendations for Other Services       Precautions / Restrictions Precautions Precautions: Fall Precaution Comments: Pt has not fallen since she fell and broke L shoulder months ago.  No surgery done.    Mobility  Bed Mobility Overal bed mobility: Needs Assistance Bed Mobility: Sidelying to Sit   Sidelying to sit: Supervision;HOB elevated            Transfers Overall transfer level: Needs assistance Equipment used: Rolling walker (2 wheeled) Transfers: Sit to/from Stand Sit to Stand: Min guard         General transfer comment: Cues for hand placement. It appears pt pushes up from her walker at home.  Pt never uses walker without assist at home.  Ambulation/Gait Ambulation/Gait assistance: Min assist Gait Distance (Feet): 60 Feet Assistive device: Rolling walker (2 wheeled) Gait Pattern/deviations: Step-through pattern;Decreased stride length;Narrow base of support;Scissoring     General Gait Details: very narrow BOS and short steps, pt mildy unsteady requiring min assist at times, pt ambulated distance to tolerance; pt reports she always has assist and gait belt with ambulating at home   Stairs             Wheelchair Mobility    Modified Rankin (Stroke Patients Only)       Balance                                             Cognition Arousal/Alertness: Awake/alert Behavior During Therapy: WFL for tasks assessed/performed Overall Cognitive Status: History of cognitive impairments - at baseline                                 General Comments: Pt is very functional with cognition. Requires a bit more processing time but was A&Ox4 with good LTM.  STM mildly impaired.      Exercises      General Comments        Pertinent Vitals/Pain Pain Assessment: Faces Faces Pain Scale: Hurts little more Pain Location: left hip (chronic from back per pt) Pain Descriptors / Indicators: Discomfort Pain Intervention(s): Monitored during session;Limited activity within patient's tolerance;Repositioned    Home Living                      Prior Function            PT Goals (current goals can now be found in the care plan section) Progress towards PT goals: Progressing toward goals    Frequency    Min 3X/week      PT Plan Current plan remains appropriate    Co-evaluation  AM-PAC PT "6 Clicks" Mobility   Outcome Measure  Help needed turning from your back to your side while in a flat bed without using bedrails?: A Little Help needed moving from lying on your back to sitting on the side of a flat bed without using bedrails?: A Little Help needed moving to and from a bed to a chair (including a wheelchair)?: A Little Help needed standing up from a chair using your arms (e.g., wheelchair or bedside chair)?: A Little Help needed to walk in hospital room?: A Lot Help needed climbing 3-5 steps with a railing? : A Lot 6 Click Score: 16    End of Session Equipment Utilized During Treatment: Gait belt Activity Tolerance: Patient tolerated treatment well Patient left: in chair;with call bell/phone within reach Nurse Communication: Mobility status PT Visit Diagnosis: Other abnormalities of gait and mobility (R26.89)     Time: 1751-0258 PT Time  Calculation (min) (ACUTE ONLY): 19 min  Charges:  $Gait Training: 8-22 mins                     Paulino Door, DPT Acute Rehabilitation Services Pager: [phone] Office: [phone]  Maida Sale E 01/22/2021, 1:06 PM

## 2021-01-22 NOTE — TOC Transition Note (Signed)
Transition of Care [facility]) - CM/SW Discharge Note   Patient Details  Name: Cassandra Patel MRN: [id number] Date of Birth: 08-04-1922  [facility] CM/SW Contact:  Ida Rogue, LCSW Phone Number: 01/22/2021, 10:46 AM   Clinical Narrative:   Patient who is stable for d/c will return home with [facility] services.  Orders seen and appreciated.  Confirmed resumption of services with Eber Jones at [facility].  Daughter states they have all needed DME equipment.  Nursing, please call daughter when patient is ready for d/c.  She will come pick up. TOC sign off.    Final next level of care: Home w Home Health Services Barriers to Discharge: No Barriers Identified   Patient Goals and CMS Choice        Discharge Placement                       Discharge Plan and Services                                     Social Determinants of Health (SDOH) Interventions     Readmission Risk Interventions No flowsheet data found.

## 2021-01-28 ENCOUNTER — Other Ambulatory Visit (HOSPITAL_BASED_OUTPATIENT_CLINIC_OR_DEPARTMENT_OTHER): Payer: Self-pay

## 2021-01-28 MED ORDER — CLONIDINE HCL 0.1 MG PO TABS
ORAL_TABLET | ORAL | 0 refills | Status: DC
  Filled 2021-01-28: qty 30, 15d supply, fill #0

## 2021-01-28 MED FILL — Diltiazem HCl Tab 120 MG: ORAL | 90 days supply | Qty: 45 | Fill #0 | Status: AC

## 2021-01-29 ENCOUNTER — Other Ambulatory Visit (HOSPITAL_BASED_OUTPATIENT_CLINIC_OR_DEPARTMENT_OTHER): Payer: Self-pay

## 2021-01-30 ENCOUNTER — Emergency Department (HOSPITAL_BASED_OUTPATIENT_CLINIC_OR_DEPARTMENT_OTHER): Payer: Medicare Other

## 2021-01-30 ENCOUNTER — Emergency Department (HOSPITAL_BASED_OUTPATIENT_CLINIC_OR_DEPARTMENT_OTHER)
Admission: EM | Admit: 2021-01-30 | Discharge: 2021-01-30 | Disposition: A | Discharge status: Home / Self Care | Payer: Medicare Other | Attending: Emergency Medicine | Admitting: Emergency Medicine

## 2021-01-30 ENCOUNTER — Other Ambulatory Visit: Payer: Self-pay

## 2021-01-30 ENCOUNTER — Encounter (HOSPITAL_BASED_OUTPATIENT_CLINIC_OR_DEPARTMENT_OTHER): Payer: Self-pay | Admitting: *Deleted

## 2021-01-30 DIAGNOSIS — R6883 Chills (without fever): Secondary | ICD-10-CM | POA: Insufficient documentation

## 2021-01-30 DIAGNOSIS — Z79899 Other long term (current) drug therapy: Secondary | ICD-10-CM | POA: Diagnosis not present

## 2021-01-30 DIAGNOSIS — F0390 Unspecified dementia without behavioral disturbance: Secondary | ICD-10-CM | POA: Insufficient documentation

## 2021-01-30 DIAGNOSIS — Z7982 Long term (current) use of aspirin: Secondary | ICD-10-CM | POA: Insufficient documentation

## 2021-01-30 DIAGNOSIS — R059 Cough, unspecified: Secondary | ICD-10-CM | POA: Diagnosis not present

## 2021-01-30 DIAGNOSIS — Z20822 Contact with and (suspected) exposure to covid-19: Secondary | ICD-10-CM | POA: Diagnosis not present

## 2021-01-30 DIAGNOSIS — I110 Hypertensive heart disease with heart failure: Secondary | ICD-10-CM | POA: Diagnosis not present

## 2021-01-30 DIAGNOSIS — M791 Myalgia, unspecified site: Secondary | ICD-10-CM | POA: Diagnosis not present

## 2021-01-30 DIAGNOSIS — R5383 Other fatigue: Secondary | ICD-10-CM

## 2021-01-30 DIAGNOSIS — R7989 Other specified abnormal findings of blood chemistry: Secondary | ICD-10-CM

## 2021-01-30 DIAGNOSIS — R3912 Poor urinary stream: Secondary | ICD-10-CM | POA: Diagnosis not present

## 2021-01-30 DIAGNOSIS — Z7902 Long term (current) use of antithrombotics/antiplatelets: Secondary | ICD-10-CM | POA: Insufficient documentation

## 2021-01-30 DIAGNOSIS — I509 Heart failure, unspecified: Secondary | ICD-10-CM | POA: Diagnosis not present

## 2021-01-30 DIAGNOSIS — R531 Weakness: Secondary | ICD-10-CM | POA: Diagnosis present

## 2021-01-30 DIAGNOSIS — E860 Dehydration: Secondary | ICD-10-CM | POA: Diagnosis not present

## 2021-01-30 LAB — RESP PANEL BY RT-PCR (FLU A&B, COVID) ARPGX2
Influenza A by PCR: NEGATIVE
Influenza B by PCR: NEGATIVE
SARS Coronavirus 2 by RT PCR: NEGATIVE

## 2021-01-30 LAB — URINALYSIS, ROUTINE W REFLEX MICROSCOPIC
Bilirubin Urine: NEGATIVE
Glucose, UA: NEGATIVE mg/dL
Hgb urine dipstick: NEGATIVE
Ketones, ur: NEGATIVE mg/dL
Leukocytes,Ua: NEGATIVE
Nitrite: NEGATIVE
Protein, ur: NEGATIVE mg/dL
Specific Gravity, Urine: 1.01 (ref 1.005–1.030)
pH: 5.5 (ref 5.0–8.0)

## 2021-01-30 LAB — CBC WITH DIFFERENTIAL/PLATELET
Abs Immature Granulocytes: 0.01 10*3/uL (ref 0.00–0.07)
Basophils Absolute: 0 10*3/uL (ref 0.0–0.1)
Basophils Relative: 1 %
Eosinophils Absolute: 0.4 10*3/uL (ref 0.0–0.5)
Eosinophils Relative: 11 %
HCT: 36.5 % (ref 36.0–46.0)
Hemoglobin: 12.2 g/dL (ref 12.0–15.0)
Immature Granulocytes: 0 %
Lymphocytes Relative: 30 %
Lymphs Abs: 1.2 10*3/uL (ref 0.7–4.0)
MCH: 32 pg (ref 26.0–34.0)
MCHC: 33.4 g/dL (ref 30.0–36.0)
MCV: 95.8 fL (ref 80.0–100.0)
Monocytes Absolute: 0.4 10*3/uL (ref 0.1–1.0)
Monocytes Relative: 9 %
Neutro Abs: 1.9 10*3/uL (ref 1.7–7.7)
Neutrophils Relative %: 49 %
Platelets: 189 10*3/uL (ref 150–400)
RBC: 3.81 MIL/uL — ABNORMAL LOW (ref 3.87–5.11)
RDW: 13.2 % (ref 11.5–15.5)
WBC: 3.9 10*3/uL — ABNORMAL LOW (ref 4.0–10.5)
nRBC: 0 % (ref 0.0–0.2)

## 2021-01-30 LAB — COMPREHENSIVE METABOLIC PANEL
ALT: 10 U/L (ref 0–44)
AST: 24 U/L (ref 15–41)
Albumin: 4.4 g/dL (ref 3.5–5.0)
Alkaline Phosphatase: 77 U/L (ref 38–126)
Anion gap: 10 (ref 5–15)
BUN: 20 mg/dL (ref 8–23)
CO2: 23 mmol/L (ref 22–32)
Calcium: 10.3 mg/dL (ref 8.9–10.3)
Chloride: 104 mmol/L (ref 98–111)
Creatinine, Ser: 1.52 mg/dL — ABNORMAL HIGH (ref 0.44–1.00)
GFR, Estimated: 31 mL/min — ABNORMAL LOW (ref >60)
Glucose, Bld: 102 mg/dL — ABNORMAL HIGH (ref 70–99)
Potassium: 3.8 mmol/L (ref 3.5–5.1)
Sodium: 137 mmol/L (ref 135–145)
Total Bilirubin: 0.4 mg/dL (ref 0.3–1.2)
Total Protein: 6.8 g/dL (ref 6.5–8.1)

## 2021-01-30 LAB — MAGNESIUM: Magnesium: 1.9 mg/dL (ref 1.7–2.4)

## 2021-01-30 LAB — LACTIC ACID, PLASMA
Lactic Acid, Venous: 2.3 mmol/L (ref 0.5–1.9)
Lactic Acid, Venous: 1.6 mmol/L (ref 0.5–1.9)

## 2021-01-30 LAB — BRAIN NATRIURETIC PEPTIDE: B Natriuretic Peptide: 180.8 pg/mL — ABNORMAL HIGH (ref 0.0–100.0)

## 2021-01-30 LAB — LIPASE, BLOOD: Lipase: 28 U/L (ref 11–51)

## 2021-01-30 LAB — TSH: TSH: 1.942 u[IU]/mL (ref 0.350–4.500)

## 2021-01-30 NOTE — ED Notes (Signed)
ED Provider at bedside. 

## 2021-01-30 NOTE — Discharge Instructions (Signed)
Your history, exam, work-up today are consistent with recurrent dehydration and elevated kidney function compared to discharge from the hospital last week.  Your other work-up was overall reassuring and similar to prior.  Overall, we feel you are safe for discharge home given your lack of other symptoms and work-up here.  Please maintain hydration at home and stay inside to not have fluid loss from sweating in the heat.  Please rest.  If any symptoms change or worsen, please return to the nearest Emergency Department please follow-up with your primary doctor in the neck several days for repeat labs and kidney function testing.

## 2021-01-30 NOTE — ED Notes (Signed)
ED Provider at bedside during triage 

## 2021-01-30 NOTE — ED Provider Notes (Signed)
[facility] EMERGENCY DEPARTMENT Provider Note   CSN: [id number] Arrival date & time: 01/30/21  1600     History Chief Complaint  Patient presents with  . Weakness    Cassandra Patel is a 87 y.o. female.  The history is provided by the patient and medical records. No language interpreter was used.  Cough Cough characteristics:  Non-productive Severity:  Moderate Onset quality:  Gradual Duration:  1 day Timing:  Constant Progression:  Unchanged Chronicity:  New Relieved by:  Nothing Worsened by:  Nothing Ineffective treatments:  None tried Associated symptoms: chills and myalgias   Associated symptoms: no chest pain, no diaphoresis, no fever, no headaches, no rash, no rhinorrhea, no shortness of breath, no sore throat and no wheezing   Risk factors comment:  Recent admission      Past Medical History:  Diagnosis Date  . Arthritis   . CHF (congestive heart failure) (HCC)   . High cholesterol   . Hypertension   . Pacemaker     Patient Active Problem List   Diagnosis Date Noted  . Weakness 01/21/2021  . Essential hypertension 01/21/2021  . Chronic CHF (HCC) 01/21/2021  . Dementia (HCC) 01/21/2021  . AKI (acute kidney injury) (HCC) 01/21/2021  . Acute kidney injury (HCC) 01/20/2021    Past Surgical History:  Procedure Laterality Date  . ABDOMINAL HYSTERECTOMY    . APPENDECTOMY    . CARDIAC SURGERY       OB History   No obstetric history on file.     No family history on file.  Social History   Tobacco Use  . Smoking status: Never Smoker  . Smokeless tobacco: Never Used  Vaping Use  . Vaping Use: Never used  Substance Use Topics  . Alcohol use: Not Currently  . Drug use: Not Currently    Home Medications Prior to Admission medications   Medication Sig Start Date End Date Taking? Authorizing Provider  acetaminophen (TYLENOL) 500 MG tablet Take 500 mg by mouth daily as needed for pain.    [provider]  aspirin 81 MG EC  tablet Take 1 tablet (81 mg total) by mouth daily.    [provider]  cloNIDine (CATAPRES) 0.1 MG tablet Take 0.1 mg by mouth as needed. For SBP >160    [provider]  cloNIDine (CATAPRES) 0.1 MG tablet Take 1 tablet (0.1 mg total) by mouth 2 times daily. 01/28/21     clopidogrel (PLAVIX) 75 MG tablet Take 1 tablet (75 mg total) by mouth daily. Start date: 12/31/2020 Stop date: 01/19/2021 12/30/20     dextromethorphan-guaiFENesin (MUCINEX DM) 30-600 MG 12hr tablet Take 1 tablet by mouth 2 (two) times daily.    [provider]  diltiazem (CARDIZEM) 120 MG tablet TAKE 1/2 TABLET BY MOUTH DAILY Patient taking differently: Take 60 mg by mouth in the morning and at bedtime. 07/02/20 07/02/21  [name], MD  donepezil (ARICEPT) 10 MG tablet TAKE 1 TABLET (10 MG TOTAL) BY MOUTH NIGHTLY FOR 30 DAYS. Patient taking differently: Take 10 mg by mouth at bedtime. 10/10/20 10/10/21  Leimberger, [name], PA-C  ferrous sulfate 324 MG TBEC Take 324 mg by mouth every other day.    [provider]  fluticasone (FLONASE) 50 MCG/ACT nasal spray Place 1 spray into both nostrils daily as needed for allergies. 11/27/20   [provider]  furosemide (LASIX) 40 MG tablet Take 40 mg by mouth every other day. 03/31/20   [provider]  isosorbide mononitrate (ISMO) 20 MG tablet Take 20 mg by mouth 2 (two) times daily. 04/29/20   [provider]  ondansetron (ZOFRAN-ODT) 4 MG disintegrating tablet DISSOLVE 1 TABLET BY MOUTH EVERY 4 HOURS AS NEEDED FOR VOMITING 09/22/20 09/22/21  Charlynne PanderYao, David Hsienta, MD  polyethylene glycol powder (GLYCOLAX/MIRALAX) 17 GM/SCOOP powder Take 17 g by mouth daily as needed for moderate constipation.    [provider]  potassium chloride SA (KLOR-CON) 20 MEQ tablet Take 1 tablet (20 mEq total) by mouth every other day for 14 days. 01/22/21 02/11/21  [name], [name], DO  rosuvastatin (CRESTOR) 10 MG tablet Take 10 mg by mouth daily. 04/29/20    [provider]    Allergies    Codeine  Review of Systems   Review of Systems  Constitutional: Positive for chills and fatigue. Negative for diaphoresis and fever.  HENT: Negative for congestion, rhinorrhea and sore throat.   Respiratory: Positive for cough. Negative for chest tightness, shortness of breath, wheezing and stridor.   Cardiovascular: Negative for chest pain, palpitations and leg swelling.  Gastrointestinal: Positive for nausea. Negative for abdominal pain, constipation, diarrhea and vomiting.  Genitourinary: Positive for decreased urine volume. Negative for dysuria.  Musculoskeletal: Positive for myalgias. Negative for back pain, neck pain and neck stiffness.  Skin: Negative for rash and wound.  Neurological: Negative for dizziness, tremors, light-headedness, numbness and headaches.  Psychiatric/Behavioral: Negative for agitation and confusion.  All other systems reviewed and are negative.   Physical Exam Updated Vital Signs BP (!) 154/57   Pulse (!) 59   Temp 99.4 F (37.4 C) (Rectal)   Resp 17   Ht 4\' 11"  (1.499 m)   Wt 50.3 kg   SpO2 100%   BMI 22.42 kg/m   Physical Exam Vitals and nursing note reviewed.  Constitutional:      General: She is not in acute distress.    Appearance: She is well-developed. She is not ill-appearing, toxic-appearing or diaphoretic.  HENT:     Head: Normocephalic and atraumatic.     Nose: No congestion or rhinorrhea.     Mouth/Throat:     Mouth: Mucous membranes are moist.     Pharynx: No oropharyngeal exudate or posterior oropharyngeal erythema.  Eyes:     Extraocular Movements: Extraocular movements intact.     Conjunctiva/sclera: Conjunctivae normal.     Pupils: Pupils are equal, round, and reactive to light.  Cardiovascular:     Rate and Rhythm: Normal rate and regular rhythm.     Heart sounds: No murmur heard.   Pulmonary:     Effort: Pulmonary effort is normal. No respiratory distress.     Breath  sounds: Normal breath sounds. No wheezing, rhonchi or rales.  Chest:     Chest wall: No tenderness.  Abdominal:     General: Abdomen is flat.     Palpations: Abdomen is soft.     Tenderness: There is no abdominal tenderness. There is no right CVA tenderness, left CVA tenderness, guarding or rebound.  Musculoskeletal:        General: No tenderness.     Cervical back: Neck supple. No tenderness.     Right lower leg: No edema.     Left lower leg: No edema.  Skin:    General: Skin is warm and dry.     Capillary Refill: Capillary refill takes less than 2 seconds.     Findings: No erythema or rash.  Neurological:     General: No focal  deficit present.     Mental Status: She is alert.     Sensory: No sensory deficit.     Motor: No weakness.  Psychiatric:        Mood and Affect: Mood normal.     ED Results / Procedures / Treatments   Labs (all labs ordered are listed, but only abnormal results are displayed) Labs Reviewed  CBC WITH DIFFERENTIAL/PLATELET - Abnormal; Notable for the following components:      Result Value   WBC 3.9 (*)    RBC 3.81 (*)    All other components within normal limits  COMPREHENSIVE METABOLIC PANEL - Abnormal; Notable for the following components:   Glucose, Bld 102 (*)    Creatinine, Ser 1.52 (*)    GFR, Estimated 31 (*)    All other components within normal limits  LACTIC ACID, PLASMA - Abnormal; Notable for the following components:   Lactic Acid, Venous 2.3 (*)    All other components within normal limits  BRAIN NATRIURETIC PEPTIDE - Abnormal; Notable for the following components:   B Natriuretic Peptide 180.8 (*)    All other components within normal limits  RESP PANEL BY RT-PCR (FLU A&B, COVID) ARPGX2  URINE CULTURE  LIPASE, BLOOD  LACTIC ACID, PLASMA  URINALYSIS, ROUTINE W REFLEX MICROSCOPIC  TSH  MAGNESIUM    EKG None  Radiology DG Chest Portable 1 View  Result Date: 01/30/2021 CLINICAL DATA:  Chills, cough EXAM: PORTABLE CHEST 1  VIEW COMPARISON:  May 2022 FINDINGS: Low lung volumes. No pleural effusion. No pneumothorax. Cardiomediastinal contours are stable and within normal limits. Left chest wall dual lead pacemaker. IMPRESSION: No acute process in the chest. Electronically Signed   By: Guadlupe Spanish M.D.   On: 01/30/2021 16:52    Procedures Procedures   Medications Ordered in ED Medications - No data to display  ED Course  I have reviewed the triage vital signs and the nursing notes.  Pertinent labs & imaging results that were available during my care of the patient were reviewed by me and considered in my medical decision making (see chart for details).    MDM Rules/Calculators/A&P                          Cassandra Patel is a 87 y.o. female with a past medical history significant for dementia, CHF with pacemaker, hypercholesterolemia, hypertension, and recent admission for AKI and dehydration who presents with subjective chills, dry cough, malaise, fatigue, body aches, and decreased urination.  Patient reports her symptoms have been ongoing since yesterday and are worsening.  She reports decreased oral intake and decreased urine output.  She denies dysuria but reports it does look darker.  She denies any chest pain, palpitations, or abdominal pain.  She denies shortness of breath but does report some dry cough.  She denies any headache, neck pain, neck stiffness, or any pain whatsoever.  No abdominal pain or back pain or flank pain.  On exam, lungs have some faint rhonchi.  No wheezing.  No chest tenderness or abdominal tenderness.  Bowel sounds appreciated.  No flank tenderness or back tenderness.    Clinically I am concerned patient may have recurrent AKI or even UTI given her decreased urine output and change in urine appearance.  With some nausea we will get screening labs and with her cough, get chest x-ray.  Anticipate reassessment for work-up to determine disposition.  Work-up was overall reassuring aside  from more  elevated creatinine.  Suspect some hemoconcentration as well.  Urinalysis does not show UTI despite the decrease in urine.  Patient's creatinine went from 1.0-1.5 which is still improved than what it was when she was admitted last week.  I suspect some mild dehydration and family reports that she is worsening a lot of time outside in the heat.  Patient was encouraged to stay inside and push hydration for the next few days and follow-up with her primary doctor for repeat metabolic panel and creatinine in the neck several days.  Given her otherwise reassuring work-up and patient having no complaints now, do not feel she needs admission again but we discussed this possibility with her recurrent AKI and dehydration.  Family feels comfortable taking patient home and encouraging fluids over the neck several days and keeping her inside the house.  They understand extremely strict return precautions for any new or worsened symptoms and given patient's otherwise well appearance, patient agrees.  They had other questions or concerns and patient was discharged in good condition.    Final Clinical Impression(s) / ED Diagnoses Final diagnoses:  Dehydration  Fatigue, unspecified type  Elevated serum creatinine    Rx / DC Orders ED Discharge Orders    None     Clinical Impression: 1. Dehydration   2. Fatigue, unspecified type   3. Elevated serum creatinine     Disposition: Discharge  Condition: Good  I have discussed the results, Dx and Tx plan with the pt(& family if present). He/she/they expressed understanding and agree(s) with the plan. Discharge instructions discussed at great length. Strict return precautions discussed and pt &/or family have verbalized understanding of the instructions. No further questions at time of discharge.    New Prescriptions   No medications on file    Follow Up: [name], MD [address] [place] [address] [phone]     [facility]  [facility] [place] [address] [id number] [address] [place] [address] [phone]        Aarilyn Dye, Canary Brim, MD 01/30/21 2120

## 2021-01-30 NOTE — ED Triage Notes (Signed)
C/o  weakness , chills , body aches , cough x 2 days

## 2021-01-31 ENCOUNTER — Other Ambulatory Visit (HOSPITAL_BASED_OUTPATIENT_CLINIC_OR_DEPARTMENT_OTHER): Payer: Self-pay

## 2021-01-31 MED ORDER — ISOSORBIDE MONONITRATE 20 MG PO TABS
ORAL_TABLET | ORAL | 5 refills | Status: AC
  Filled 2021-01-31: qty 60, 30d supply, fill #0
  Filled 2021-02-27: qty 60, 30d supply, fill #1

## 2021-01-31 MED ORDER — DONEPEZIL HCL 10 MG PO TABS
ORAL_TABLET | ORAL | 5 refills | Status: AC
  Filled 2021-01-31: qty 30, 30d supply, fill #0
  Filled 2021-02-27: qty 30, 30d supply, fill #1
  Filled 2021-06-09: qty 30, 30d supply, fill #2
  Filled 2021-07-01: qty 30, 30d supply, fill #3

## 2021-01-31 MED ORDER — CLONIDINE HCL 0.1 MG PO TABS
ORAL_TABLET | ORAL | 5 refills | Status: DC
  Filled 2021-01-31: qty 60, 30d supply, fill #0

## 2021-01-31 MED ORDER — ROSUVASTATIN CALCIUM 10 MG PO TABS
ORAL_TABLET | ORAL | 1 refills | Status: AC
  Filled 2021-01-31: qty 90, 90d supply, fill #0

## 2021-02-01 LAB — URINE CULTURE

## 2021-02-03 ENCOUNTER — Other Ambulatory Visit (HOSPITAL_BASED_OUTPATIENT_CLINIC_OR_DEPARTMENT_OTHER): Payer: Self-pay

## 2021-02-03 MED ORDER — AMLODIPINE BESYLATE 5 MG PO TABS
ORAL_TABLET | ORAL | 0 refills | Status: AC
  Filled 2021-02-03: qty 30, 30d supply, fill #0

## 2021-02-03 MED ORDER — FUROSEMIDE 20 MG PO TABS
ORAL_TABLET | ORAL | 0 refills | Status: DC
  Filled 2021-02-03 – 2021-05-27 (×2): qty 15, 30d supply, fill #0

## 2021-02-05 ENCOUNTER — Other Ambulatory Visit (HOSPITAL_BASED_OUTPATIENT_CLINIC_OR_DEPARTMENT_OTHER): Payer: Self-pay

## 2021-02-14 ENCOUNTER — Other Ambulatory Visit (HOSPITAL_BASED_OUTPATIENT_CLINIC_OR_DEPARTMENT_OTHER): Payer: Self-pay

## 2021-02-25 ENCOUNTER — Other Ambulatory Visit (HOSPITAL_BASED_OUTPATIENT_CLINIC_OR_DEPARTMENT_OTHER): Payer: Self-pay

## 2021-02-27 ENCOUNTER — Other Ambulatory Visit (HOSPITAL_BASED_OUTPATIENT_CLINIC_OR_DEPARTMENT_OTHER): Payer: Self-pay

## 2021-02-28 ENCOUNTER — Other Ambulatory Visit (HOSPITAL_BASED_OUTPATIENT_CLINIC_OR_DEPARTMENT_OTHER): Payer: Self-pay

## 2021-03-04 ENCOUNTER — Other Ambulatory Visit (HOSPITAL_BASED_OUTPATIENT_CLINIC_OR_DEPARTMENT_OTHER): Payer: Self-pay

## 2021-03-04 MED ORDER — CEPHALEXIN 500 MG PO CAPS
ORAL_CAPSULE | ORAL | 0 refills | Status: AC
  Filled 2021-03-04: qty 14, 7d supply, fill #0

## 2021-03-05 ENCOUNTER — Other Ambulatory Visit (HOSPITAL_BASED_OUTPATIENT_CLINIC_OR_DEPARTMENT_OTHER): Payer: Self-pay

## 2021-04-04 ENCOUNTER — Other Ambulatory Visit (HOSPITAL_BASED_OUTPATIENT_CLINIC_OR_DEPARTMENT_OTHER): Payer: Self-pay

## 2021-04-04 MED ORDER — ISOSORBIDE MONONITRATE 20 MG PO TABS
ORAL_TABLET | ORAL | 5 refills | Status: AC
  Filled 2021-04-04: qty 60, 30d supply, fill #0
  Filled 2021-06-09: qty 60, 30d supply, fill #1
  Filled 2021-07-01: qty 60, 30d supply, fill #2

## 2021-04-04 MED ORDER — POTASSIUM CHLORIDE CRYS ER 20 MEQ PO TBCR
EXTENDED_RELEASE_TABLET | ORAL | 0 refills | Status: DC
  Filled 2021-04-04: qty 8, 30d supply, fill #0

## 2021-04-10 ENCOUNTER — Other Ambulatory Visit (HOSPITAL_BASED_OUTPATIENT_CLINIC_OR_DEPARTMENT_OTHER): Payer: Self-pay

## 2021-04-10 MED ORDER — BUPRENORPHINE 5 MCG/HR TD PTWK
MEDICATED_PATCH | TRANSDERMAL | 0 refills | Status: AC
  Filled 2021-04-10: qty 4, 28d supply, fill #0

## 2021-04-11 ENCOUNTER — Other Ambulatory Visit (HOSPITAL_BASED_OUTPATIENT_CLINIC_OR_DEPARTMENT_OTHER): Payer: Self-pay

## 2021-04-16 ENCOUNTER — Other Ambulatory Visit (HOSPITAL_BASED_OUTPATIENT_CLINIC_OR_DEPARTMENT_OTHER): Payer: Self-pay

## 2021-04-16 MED ORDER — MUPIROCIN 2 % EX OINT
TOPICAL_OINTMENT | CUTANEOUS | 0 refills | Status: DC
  Filled 2021-04-16: qty 22, 7d supply, fill #0

## 2021-04-21 ENCOUNTER — Other Ambulatory Visit (HOSPITAL_BASED_OUTPATIENT_CLINIC_OR_DEPARTMENT_OTHER): Payer: Self-pay

## 2021-05-02 ENCOUNTER — Other Ambulatory Visit (HOSPITAL_BASED_OUTPATIENT_CLINIC_OR_DEPARTMENT_OTHER): Payer: Self-pay

## 2021-05-02 MED FILL — Diltiazem HCl Tab 120 MG: ORAL | 90 days supply | Qty: 45 | Fill #1 | Status: AC

## 2021-05-08 ENCOUNTER — Other Ambulatory Visit (HOSPITAL_BASED_OUTPATIENT_CLINIC_OR_DEPARTMENT_OTHER): Payer: Self-pay

## 2021-05-08 MED ORDER — BUPRENORPHINE 7.5 MCG/HR TD PTWK
1.0000 | MEDICATED_PATCH | TRANSDERMAL | 0 refills | Status: DC
  Filled 2021-05-08: qty 4, 28d supply, fill #0

## 2021-05-09 ENCOUNTER — Other Ambulatory Visit (HOSPITAL_BASED_OUTPATIENT_CLINIC_OR_DEPARTMENT_OTHER): Payer: Self-pay

## 2021-05-15 ENCOUNTER — Other Ambulatory Visit: Payer: Self-pay

## 2021-05-15 ENCOUNTER — Encounter (HOSPITAL_BASED_OUTPATIENT_CLINIC_OR_DEPARTMENT_OTHER): Payer: Self-pay

## 2021-05-15 ENCOUNTER — Emergency Department (HOSPITAL_BASED_OUTPATIENT_CLINIC_OR_DEPARTMENT_OTHER): Payer: Medicare Other

## 2021-05-15 ENCOUNTER — Emergency Department (HOSPITAL_BASED_OUTPATIENT_CLINIC_OR_DEPARTMENT_OTHER)
Admission: EM | Admit: 2021-05-15 | Discharge: 2021-05-15 | Disposition: A | Discharge status: Home / Self Care | Payer: Medicare Other | Attending: Emergency Medicine | Admitting: Emergency Medicine

## 2021-05-15 DIAGNOSIS — Z7982 Long term (current) use of aspirin: Secondary | ICD-10-CM | POA: Insufficient documentation

## 2021-05-15 DIAGNOSIS — Z950 Presence of cardiac pacemaker: Secondary | ICD-10-CM | POA: Insufficient documentation

## 2021-05-15 DIAGNOSIS — I509 Heart failure, unspecified: Secondary | ICD-10-CM | POA: Insufficient documentation

## 2021-05-15 DIAGNOSIS — F0390 Unspecified dementia without behavioral disturbance: Secondary | ICD-10-CM | POA: Diagnosis not present

## 2021-05-15 DIAGNOSIS — Z20822 Contact with and (suspected) exposure to covid-19: Secondary | ICD-10-CM | POA: Diagnosis not present

## 2021-05-15 DIAGNOSIS — I110 Hypertensive heart disease with heart failure: Secondary | ICD-10-CM | POA: Insufficient documentation

## 2021-05-15 DIAGNOSIS — I10 Essential (primary) hypertension: Secondary | ICD-10-CM

## 2021-05-15 DIAGNOSIS — M791 Myalgia, unspecified site: Secondary | ICD-10-CM | POA: Diagnosis not present

## 2021-05-15 DIAGNOSIS — R6883 Chills (without fever): Secondary | ICD-10-CM

## 2021-05-15 DIAGNOSIS — R059 Cough, unspecified: Secondary | ICD-10-CM

## 2021-05-15 DIAGNOSIS — R5383 Other fatigue: Secondary | ICD-10-CM | POA: Diagnosis present

## 2021-05-15 DIAGNOSIS — Z79899 Other long term (current) drug therapy: Secondary | ICD-10-CM | POA: Insufficient documentation

## 2021-05-15 LAB — URINALYSIS, ROUTINE W REFLEX MICROSCOPIC
Bilirubin Urine: NEGATIVE
Glucose, UA: NEGATIVE mg/dL
Hgb urine dipstick: NEGATIVE
Ketones, ur: NEGATIVE mg/dL
Leukocytes,Ua: NEGATIVE
Nitrite: NEGATIVE
Protein, ur: NEGATIVE mg/dL
Specific Gravity, Urine: 1.01 (ref 1.005–1.030)
pH: 6 (ref 5.0–8.0)

## 2021-05-15 LAB — CBC WITH DIFFERENTIAL/PLATELET
Abs Immature Granulocytes: 0.01 10*3/uL (ref 0.00–0.07)
Basophils Absolute: 0.1 10*3/uL (ref 0.0–0.1)
Basophils Relative: 2 %
Eosinophils Absolute: 0.4 10*3/uL (ref 0.0–0.5)
Eosinophils Relative: 10 %
HCT: 40.2 % (ref 36.0–46.0)
Hemoglobin: 13 g/dL (ref 12.0–15.0)
Immature Granulocytes: 0 %
Lymphocytes Relative: 34 %
Lymphs Abs: 1.4 10*3/uL (ref 0.7–4.0)
MCH: 32.6 pg (ref 26.0–34.0)
MCHC: 32.3 g/dL (ref 30.0–36.0)
MCV: 100.8 fL — ABNORMAL HIGH (ref 80.0–100.0)
Monocytes Absolute: 0.3 10*3/uL (ref 0.1–1.0)
Monocytes Relative: 8 %
Neutro Abs: 1.9 10*3/uL (ref 1.7–7.7)
Neutrophils Relative %: 46 %
Platelets: 161 10*3/uL (ref 150–400)
RBC: 3.99 MIL/uL (ref 3.87–5.11)
RDW: 13.6 % (ref 11.5–15.5)
WBC: 4.2 10*3/uL (ref 4.0–10.5)
nRBC: 0 % (ref 0.0–0.2)

## 2021-05-15 LAB — COMPREHENSIVE METABOLIC PANEL
ALT: 9 U/L (ref 0–44)
AST: 26 U/L (ref 15–41)
Albumin: 4.5 g/dL (ref 3.5–5.0)
Alkaline Phosphatase: 70 U/L (ref 38–126)
Anion gap: 12 (ref 5–15)
BUN: 21 mg/dL (ref 8–23)
CO2: 21 mmol/L — ABNORMAL LOW (ref 22–32)
Calcium: 10.7 mg/dL — ABNORMAL HIGH (ref 8.9–10.3)
Chloride: 106 mmol/L (ref 98–111)
Creatinine, Ser: 0.86 mg/dL (ref 0.44–1.00)
GFR, Estimated: >60 mL/min (ref >60)
Glucose, Bld: 70 mg/dL (ref 70–99)
Potassium: 4.3 mmol/L (ref 3.5–5.1)
Sodium: 139 mmol/L (ref 135–145)
Total Bilirubin: 0.5 mg/dL (ref 0.3–1.2)
Total Protein: 6.8 g/dL (ref 6.5–8.1)

## 2021-05-15 LAB — RESP PANEL BY RT-PCR (FLU A&B, COVID) ARPGX2
Influenza A by PCR: NEGATIVE
Influenza B by PCR: NEGATIVE
SARS Coronavirus 2 by RT PCR: NEGATIVE

## 2021-05-15 LAB — TROPONIN I (HIGH SENSITIVITY): Troponin I (High Sensitivity): 6 ng/L (ref <18)

## 2021-05-15 NOTE — ED Provider Notes (Signed)
[facility] EMERGENCY DEPARTMENT Provider Note   CSN: [id number] Arrival date & time: 05/15/21  0912     History Chief Complaint  Patient presents with   Cough   Fatigue    Cassandra Patel is a 87 y.o. female.  Presenting to ER with concern for generalized fatigue.  Patient states over the past day or so she has had increased generalized fatigue.  Has also noted body aches and feels cold.  Denies chills or fever.  Has a chronic cough that occasionally has clear or yellow sputum.  No change in cough today.  No chest pain or difficulty in breathing.  No numbness, weakness, speech or vision changes.  Last week, patient underwent elective cardiac pacemaker replacement.  She has a history of sick sinus syndrome.  Original pacemaker placed in 2010.  No complications with procedure to their knowledge.  Additional history obtained from chart review via care everywhere and discussion with daughter at bedside. HPI     Past Medical History:  Diagnosis Date   Arthritis    CHF (congestive heart failure) (HCC)    High cholesterol    Hypertension    Pacemaker     Patient Active Problem List   Diagnosis Date Noted   Weakness 01/21/2021   Essential hypertension 01/21/2021   Chronic CHF (HCC) 01/21/2021   Dementia (HCC) 01/21/2021   AKI (acute kidney injury) (HCC) 01/21/2021   Acute kidney injury (HCC) 01/20/2021    Past Surgical History:  Procedure Laterality Date   ABDOMINAL HYSTERECTOMY     APPENDECTOMY     CARDIAC SURGERY       OB History   No obstetric history on file.     History reviewed. No pertinent family history.  Social History   Tobacco Use   Smoking status: Never   Smokeless tobacco: Never  Vaping Use   Vaping Use: Never used  Substance Use Topics   Alcohol use: Not Currently   Drug use: Not Currently    Home Medications Prior to Admission medications   Medication Sig Start Date End Date Taking? Authorizing Provider  acetaminophen (TYLENOL)  500 MG tablet Take 500 mg by mouth daily as needed for pain.   Yes [provider]  aspirin 81 MG EC tablet Take 1 tablet (81 mg total) by mouth daily.   Yes [provider]  buprenorphine (BUTRANS) 7.5 MCG/HR Place 1 patch onto the skin once a week. 05/08/21  Yes   diltiazem (CARDIZEM) 120 MG tablet TAKE 1/2 TABLET BY MOUTH DAILY Patient taking differently: Take 60 mg by mouth in the morning and at bedtime. 07/02/20 07/31/21 Yes [name], MD  diltiazem (CARDIZEM) 30 MG tablet Take 30 mg by mouth 2 (two) times daily. 03/08/21  Yes [provider]  donepezil (ARICEPT) 10 MG tablet TAKE 1 TABLET (10 MG TOTAL) BY MOUTH NIGHTLY FOR 30 DAYS. Patient taking differently: Take 10 mg by mouth at bedtime. 10/10/20 10/10/21 Yes Leimberger, [name], PA-C  donepezil (ARICEPT) 10 MG tablet Take 1 tablet (10 mg total) by mouth nightly. 01/31/21  Yes   donepezil (ARICEPT) 10 MG tablet Take 10 mg by mouth at bedtime. 01/31/21  Yes [provider]  furosemide (LASIX) 20 MG tablet Take 20 mg by mouth every other day. 02/03/21  Yes [provider]  potassium chloride SA (KLOR-CON) 20 MEQ tablet Take 1/2 tablet (10 mEq total) by mouth every other day for 30 days. 04/04/21  Yes   rosuvastatin (CRESTOR) 10 MG tablet  Take 10 mg by mouth at bedtime. 01/31/21  Yes [provider]  amLODipine (NORVASC) 5 MG tablet Take 1 tablet (5 mg total) by mouth daily 02/03/21     buprenorphine (BUTRANS) 5 MCG/HR PTWK Place 1 patch onto the skin every 7 days for 28 days. 04/10/21     cephALEXin (KEFLEX) 500 MG capsule Take 1 capsule (500 mg total) by mouth 2 times daily for 7 days. 03/04/21     cloNIDine (CATAPRES) 0.1 MG tablet Take 0.1 mg by mouth as needed. For SBP >160    [provider]  clopidogrel (PLAVIX) 75 MG tablet Take 1 tablet (75 mg total) by mouth daily. Start date: 12/31/2020 Stop date: 01/19/2021 12/30/20     dextromethorphan-guaiFENesin (MUCINEX DM) 30-600 MG 12hr tablet Take 1  tablet by mouth 2 (two) times daily.    [provider]  ferrous sulfate 324 MG TBEC Take 324 mg by mouth every other day.    [provider]  fluticasone (FLONASE) 50 MCG/ACT nasal spray Place 1 spray into both nostrils daily as needed for allergies. 11/27/20   [provider]  furosemide (LASIX) 20 MG tablet Take 1 tablet (20 mg total) by mouth every other day 02/03/21     furosemide (LASIX) 40 MG tablet Take 40 mg by mouth every other day. 03/31/20   [provider]  isosorbide mononitrate (ISMO) 20 MG tablet Take 20 mg by mouth 2 (two) times daily. 04/29/20   [provider]  isosorbide mononitrate (ISMO) 20 MG tablet Take 1 tablet (20 mg total) by mouth 2 times daily. 01/31/21     isosorbide mononitrate (ISMO) 20 MG tablet Take 1 tablet (20 mg total) by mouth 2 times daily. 04/04/21     mupirocin ointment (BACTROBAN) 2 % Apply to each nare with a clean cotton swab twice daily and morning of procedure starting 2 days prior to procedure 04/16/21     ondansetron (ZOFRAN-ODT) 4 MG disintegrating tablet DISSOLVE 1 TABLET BY MOUTH EVERY 4 HOURS AS NEEDED FOR VOMITING 09/22/20 09/22/21  Charlynne Pander, MD  polyethylene glycol powder (GLYCOLAX/MIRALAX) 17 GM/SCOOP powder Take 17 g by mouth daily as needed for moderate constipation.    [provider]  potassium chloride (KLOR-CON) 10 MEQ tablet Take 10 mEq by mouth every other day. 03/12/21   [provider]  potassium chloride SA (KLOR-CON) 20 MEQ tablet Take 1 tablet (20 mEq total) by mouth every other day for 14 days. 01/22/21 02/11/21  Darlin Drop, DO  rosuvastatin (CRESTOR) 10 MG tablet Take 10 mg by mouth daily. 04/29/20   [provider]  rosuvastatin (CRESTOR) 10 MG tablet Take 1 tablet (10 mg total) by mouth nightly. 01/31/21       Allergies    Codeine  Review of Systems   Review of Systems  Constitutional:  Positive for chills. Negative for fever.  HENT:  Negative for ear pain  and sore throat.   Eyes:  Negative for pain and visual disturbance.  Respiratory:  Positive for cough. Negative for shortness of breath.   Cardiovascular:  Negative for chest pain and palpitations.  Gastrointestinal:  Negative for abdominal pain and vomiting.  Genitourinary:  Negative for dysuria and hematuria.  Musculoskeletal:  Negative for arthralgias and back pain.  Skin:  Negative for color change and rash.  Neurological:  Negative for seizures and syncope.  All other systems reviewed and are negative.  Physical Exam Updated Vital Signs BP (!) 209/72   Pulse Marland Kitchen)  58   Temp 98.3 F (36.8 C) (Oral)   Resp 18   Ht 4\' 11"  (1.499 m)   Wt 46.6 kg   SpO2 95%   BMI 20.76 kg/m   Physical Exam Vitals and nursing note reviewed.  Constitutional:      General: She is not in acute distress.    Appearance: She is well-developed.  HENT:     Head: Normocephalic and atraumatic.  Eyes:     Conjunctiva/sclera: Conjunctivae normal.  Cardiovascular:     Rate and Rhythm: Normal rate and regular rhythm.     Heart sounds: No murmur heard. Pulmonary:     Effort: Pulmonary effort is normal. No respiratory distress.     Breath sounds: Normal breath sounds.  Abdominal:     Palpations: Abdomen is soft.     Tenderness: There is no abdominal tenderness.  Musculoskeletal:     Cervical back: Neck supple.  Skin:    General: Skin is warm and dry.  Neurological:     Mental Status: She is alert.     Comments: AAOx3 CN 2-12 intact, speech clear visual fields intact 5/5 strength in b/l UE and LE Sensation to light touch intact in b/l UE and LE Normal FNF    ED Results / Procedures / Treatments   Labs (all labs ordered are listed, but only abnormal results are displayed) Labs Reviewed  CBC WITH DIFFERENTIAL/PLATELET - Abnormal; Notable for the following components:      Result Value   MCV 100.8 (*)    All other components within normal limits  COMPREHENSIVE METABOLIC PANEL - Abnormal;  Notable for the following components:   CO2 21 (*)    Calcium 10.7 (*)    All other components within normal limits  URINALYSIS, ROUTINE W REFLEX MICROSCOPIC - Abnormal; Notable for the following components:   Color, Urine STRAW (*)    All other components within normal limits  RESP PANEL BY RT-PCR (FLU A&B, COVID) ARPGX2  TROPONIN I (HIGH SENSITIVITY)    EKG EKG Interpretation  Date/Time:  Thursday May 15 2021 09:54:10 EDT Ventricular Rate:  63 PR Interval:  110 QRS Duration: 157 QT Interval:  491 QTC Calculation: 503 R Axis:   -62 Text Interpretation: Sinus rhythm Borderline short PR interval Left bundle branch block Confirmed by [id number] (Marianna Fuss) on 05/15/2021 9:55:31 AM  Radiology CT HEAD WO CONTRAST (05/17/2021)  Result Date: 05/15/2021 CLINICAL DATA:  Mental status change with unknown cause EXAM: CT HEAD WITHOUT CONTRAST TECHNIQUE: Contiguous axial images were obtained from the base of the skull through the vertex without intravenous contrast. COMPARISON:  01/20/2021 FINDINGS: Brain: No evidence of acute infarction, hemorrhage, hydrocephalus, extra-axial collection or mass lesion/mass effect. Remote perforator infarct at the right internal capsule. Chronic lacunar infarct at the right caudate head. Chronic small vessel ischemic gliosis in the cerebral white matter. Mild for age cerebral volume loss. Vascular: No hyperdense vessel or unexpected calcification. Skull: Normal. Negative for fracture or focal lesion. Sinuses/Orbits: No acute finding. IMPRESSION: 1. No acute or interval finding. 2. Chronic small vessel disease with remote small-vessel infarcts. Electronically Signed   By: 01/22/2021 M.D.   On: 05/15/2021 10:43   DG Chest Port 1 View  Result Date: 05/15/2021 CLINICAL DATA:  Cough, fatigue, weakness and body aches. Cough fatigue and body aches. EXAM: PORTABLE CHEST 1 VIEW COMPARISON:  01/20/2021 FINDINGS: The heart is within normal limits in size given the AP  projection and portable technique. There is tortuosity and calcification  of the thoracic aorta. The lungs are clear of an acute process. No infiltrates, edema or effusions. No pneumothorax. Prominent skin fold noted over the right lateral chest. The pacer wires are stable. The bony thorax is intact. IMPRESSION: No acute cardiopulmonary findings. Electronically Signed   By: Rudie Meyer M.D.   On: 05/15/2021 11:35    Procedures Procedures   Medications Ordered in ED Medications - No data to display  ED Course  I have reviewed the triage vital signs and the nursing notes.  Pertinent labs & imaging results that were available during my care of the patient were reviewed by me and considered in my medical decision making (see chart for details).    MDM Rules/Calculators/A&P                           87 year old lady presented to ER with concern for generalized fatigue/weakness and having sensation of being cold.  On physical exam, patient is an elderly lady in no acute distress.  Nonfocal neurologic exam.  Did check CT head which was negative for acute pathology.  Her basic labs are stable.  No anemia, no electrolyte derangement.  No pneumonia.  No UTI.  Trop within normal limits.  No chest pain.  Doubt ACS.  On reassessment, patient endorsed feeling somewhat cold but denied any other acute complaints.  Given the reassuring work-up and her reassuring appearance at present, believe she can be discharged and managed in the outpatient setting.  Noted to be hypertensive, patient states that she has not taken her regular blood pressure medication this morning.  Offered to provide her blood pressure meds in ER and further observe however both patient and daughter request to be discharged and will take her medicine at home.  Believe this is reasonable option given her reassuring work-up and appearance at present.  Given her recent procedure, recommend she follow-up with cardiology in addition to following up  with her primary care doctor.    After the discussed management above, the patient was determined to be safe for discharge.  The patient was in agreement with this plan and all questions regarding their care were answered.  ED return precautions were discussed and the patient will return to the ED with any significant worsening of condition.  Final Clinical Impression(s) / ED Diagnoses Final diagnoses:  Chills (without fever)  Hypertension, unspecified type    Rx / DC Orders ED Discharge Orders     None        Milagros Loll, MD 05/15/21 1336

## 2021-05-15 NOTE — Discharge Instructions (Addendum)
Take your blood pressure medication as previously prescribed.  Follow-up both with your cardiologist and with your primary care doctor.  If you develop any numbness, weakness, speech or vision change, chest pain, difficulty breathing, fever or other new concerning symptom, please return to ER for reassessment.

## 2021-05-15 NOTE — ED Notes (Signed)
Pt placed on purewick 

## 2021-05-15 NOTE — ED Notes (Signed)
Pt transported to CT/xray 

## 2021-05-15 NOTE — ED Notes (Signed)
IV start attempted x1, unsuccessful.  2nd RN asked to attempt IV start for blood draw.

## 2021-05-15 NOTE — ED Notes (Signed)
Pt transported to CT ?

## 2021-05-15 NOTE — ED Notes (Signed)
ED Provider at bedside. 

## 2021-05-15 NOTE — ED Triage Notes (Signed)
Patient having cough, fatigue, body aches, increased weakness.  Patient just had a pacemaker put in last Friday at [facility].

## 2021-05-27 ENCOUNTER — Other Ambulatory Visit (HOSPITAL_BASED_OUTPATIENT_CLINIC_OR_DEPARTMENT_OTHER): Payer: Self-pay

## 2021-05-27 MED ORDER — POTASSIUM CHLORIDE CRYS ER 20 MEQ PO TBCR
EXTENDED_RELEASE_TABLET | ORAL | 0 refills | Status: DC
  Filled 2021-05-27: qty 8, 30d supply, fill #0

## 2021-06-06 ENCOUNTER — Other Ambulatory Visit (HOSPITAL_BASED_OUTPATIENT_CLINIC_OR_DEPARTMENT_OTHER): Payer: Self-pay

## 2021-06-06 MED ORDER — BUPRENORPHINE 7.5 MCG/HR TD PTWK
MEDICATED_PATCH | TRANSDERMAL | 1 refills | Status: AC
  Filled 2021-06-06: qty 4, 28d supply, fill #0

## 2021-06-09 ENCOUNTER — Other Ambulatory Visit (HOSPITAL_BASED_OUTPATIENT_CLINIC_OR_DEPARTMENT_OTHER): Payer: Self-pay

## 2021-06-18 ENCOUNTER — Other Ambulatory Visit (HOSPITAL_BASED_OUTPATIENT_CLINIC_OR_DEPARTMENT_OTHER): Payer: Self-pay

## 2021-06-18 MED ORDER — FUROSEMIDE 20 MG PO TABS
ORAL_TABLET | ORAL | 0 refills | Status: DC
  Filled 2021-06-18: qty 45, 90d supply, fill #0

## 2021-06-18 MED ORDER — POTASSIUM CHLORIDE CRYS ER 20 MEQ PO TBCR
EXTENDED_RELEASE_TABLET | ORAL | 0 refills | Status: AC
  Filled 2021-06-18: qty 23, 90d supply, fill #0
  Filled 2022-02-03: qty 23, 90d supply, fill #1

## 2021-06-18 MED ORDER — DILTIAZEM HCL 30 MG PO TABS
ORAL_TABLET | ORAL | 0 refills | Status: DC
  Filled 2021-06-18: qty 180, 90d supply, fill #0

## 2021-06-19 ENCOUNTER — Other Ambulatory Visit (HOSPITAL_BASED_OUTPATIENT_CLINIC_OR_DEPARTMENT_OTHER): Payer: Self-pay

## 2021-07-02 ENCOUNTER — Other Ambulatory Visit (HOSPITAL_BASED_OUTPATIENT_CLINIC_OR_DEPARTMENT_OTHER): Payer: Self-pay

## 2021-07-31 ENCOUNTER — Other Ambulatory Visit (HOSPITAL_BASED_OUTPATIENT_CLINIC_OR_DEPARTMENT_OTHER): Payer: Self-pay

## 2021-07-31 MED ORDER — PAXLOVID (150/100) 10 X 150 MG & 10 X 100MG PO TBPK
ORAL_TABLET | ORAL | 0 refills | Status: DC
  Filled 2021-07-31: qty 20, 5d supply, fill #0

## 2021-09-14 ENCOUNTER — Emergency Department (HOSPITAL_BASED_OUTPATIENT_CLINIC_OR_DEPARTMENT_OTHER): Payer: Medicare Other

## 2021-09-14 ENCOUNTER — Emergency Department (HOSPITAL_BASED_OUTPATIENT_CLINIC_OR_DEPARTMENT_OTHER)
Admission: EM | Admit: 2021-09-14 | Discharge: 2021-09-14 | Disposition: A | Discharge status: Home / Self Care | Payer: Medicare Other | Attending: Emergency Medicine | Admitting: Emergency Medicine

## 2021-09-14 ENCOUNTER — Encounter (HOSPITAL_BASED_OUTPATIENT_CLINIC_OR_DEPARTMENT_OTHER): Payer: Self-pay | Admitting: Emergency Medicine

## 2021-09-14 DIAGNOSIS — I110 Hypertensive heart disease with heart failure: Secondary | ICD-10-CM | POA: Insufficient documentation

## 2021-09-14 DIAGNOSIS — Z79899 Other long term (current) drug therapy: Secondary | ICD-10-CM | POA: Insufficient documentation

## 2021-09-14 DIAGNOSIS — Z7982 Long term (current) use of aspirin: Secondary | ICD-10-CM | POA: Insufficient documentation

## 2021-09-14 DIAGNOSIS — I509 Heart failure, unspecified: Secondary | ICD-10-CM | POA: Diagnosis not present

## 2021-09-14 DIAGNOSIS — M25552 Pain in left hip: Secondary | ICD-10-CM | POA: Insufficient documentation

## 2021-09-14 DIAGNOSIS — Z950 Presence of cardiac pacemaker: Secondary | ICD-10-CM | POA: Diagnosis not present

## 2021-09-14 MED ORDER — HYDROCODONE-ACETAMINOPHEN 5-325 MG PO TABS
1.0000 | ORAL_TABLET | ORAL | 0 refills | Status: AC | PRN
  Filled 2021-09-14: qty 10, 2d supply, fill #0

## 2021-09-14 MED ORDER — HYDROCODONE-ACETAMINOPHEN 5-325 MG PO TABS
1.0000 | ORAL_TABLET | Freq: Once | ORAL | Status: AC
  Administered 2021-09-14: 1 via ORAL
  Filled 2021-09-14: qty 1

## 2021-09-14 NOTE — Discharge Instructions (Signed)
Her history, exam, work-up today are suspicious for exacerbation of her chronic pain after her procedure in the left hip that was likely worsened by more movement and decrease in pain regimen use at home.  As we discussed together, the x-ray was reassuring and based on her reassuring exam we agreed to hold on more advanced imaging at this time.  We do recommend using the Lidoderm patches you have at home and we will briefly increase her pain regimen until she can see her doctor in the next few days.  Please have her rest and stay hydrated.  We did discuss the possibility of ultrasound however as her symptoms do not seem consistent with DVT and you all have already made a decision not to anticoagulate, we agree to hold on this.  If any symptoms change or worsen, please return to the nearest emergency department.  Please have her rest and stay hydrated.

## 2021-09-14 NOTE — ED Triage Notes (Signed)
Pt arrives pov with daughter, reports hip fx x 1 month, had "screw" placed after Thanksgiving. Pt reports left side hip pain. Took tramadol at 1230 with no relief.

## 2021-09-14 NOTE — ED Provider Notes (Signed)
[facility] EMERGENCY DEPARTMENT Provider Note   CSN: [id number] Arrival date & time: 09/14/21  1502     History  Chief Complaint  Patient presents with   Hip Pain    Cassandra Patel is a 88 y.o. female.  The history is provided by the patient, a relative and medical records. No language interpreter was used.  Hip Pain This is a recurrent problem. The current episode started 6 to 12 hours ago. The problem occurs constantly. The problem has not changed since onset.Pertinent negatives include no chest pain, no abdominal pain, no headaches and no shortness of breath. The symptoms are aggravated by walking. Nothing relieves the symptoms. She has tried nothing for the symptoms. The treatment provided no relief.      Home Medications Prior to Admission medications   Medication Sig Start Date End Date Taking? Authorizing Provider  acetaminophen (TYLENOL) 500 MG tablet Take 500 mg by mouth daily as needed for pain.    [provider]  amLODipine (NORVASC) 5 MG tablet Take 1 tablet (5 mg total) by mouth daily 02/03/21     aspirin 81 MG EC tablet Take 1 tablet (81 mg total) by mouth daily.    [provider]  buprenorphine (BUTRANS) 5 MCG/HR PTWK Place 1 patch onto the skin every 7 days for 28 days. 04/10/21     buprenorphine (BUTRANS) 7.5 MCG/HR Place 1 patch onto the skin every 7 days for 28 days. 06/05/21     cephALEXin (KEFLEX) 500 MG capsule Take 1 capsule (500 mg total) by mouth 2 times daily for 7 days. 03/04/21     cloNIDine (CATAPRES) 0.1 MG tablet Take 0.1 mg by mouth as needed. For SBP >160    [provider]  clopidogrel (PLAVIX) 75 MG tablet Take 1 tablet (75 mg total) by mouth daily. Start date: 12/31/2020 Stop date: 01/19/2021 12/30/20     dextromethorphan-guaiFENesin (MUCINEX DM) 30-600 MG 12hr tablet Take 1 tablet by mouth 2 (two) times daily.    [provider]  diltiazem (CARDIZEM) 120 MG tablet TAKE 1/2 TABLET BY MOUTH DAILY Patient taking  differently: Take 60 mg by mouth in the morning and at bedtime. 07/02/20 07/31/21  [name], MD  diltiazem (CARDIZEM) 30 MG tablet Take 30 mg by mouth 2 (two) times daily. 03/08/21   [provider]  diltiazem (CARDIZEM) 30 MG tablet Take 1 tablet (30 mg total) by mouth in the morning and at bedtime. 06/18/21     donepezil (ARICEPT) 10 MG tablet TAKE 1 TABLET (10 MG TOTAL) BY MOUTH NIGHTLY FOR 30 DAYS. Patient taking differently: Take 10 mg by mouth at bedtime. 10/10/20 10/10/21  Leimberger, [name], PA-C  donepezil (ARICEPT) 10 MG tablet Take 1 tablet (10 mg total) by mouth nightly. 01/31/21     donepezil (ARICEPT) 10 MG tablet Take 10 mg by mouth at bedtime. 01/31/21   [provider]  ferrous sulfate 324 MG TBEC Take 324 mg by mouth every other day.    [provider]  fluticasone (FLONASE) 50 MCG/ACT nasal spray Place 1 spray into both nostrils daily as needed for allergies. 11/27/20   [provider]  furosemide (LASIX) 20 MG tablet Take 20 mg by mouth every other day. 02/03/21   [provider]  furosemide (LASIX) 20 MG tablet Take 1 tablet (20 mg total) by mouth every other day. 06/18/21     furosemide (LASIX) 40 MG tablet Take 40 mg by mouth every other day. 03/31/20  [provider]  isosorbide mononitrate (ISMO) 20 MG tablet Take 20 mg by mouth 2 (two) times daily. 04/29/20   [provider]  isosorbide mononitrate (ISMO) 20 MG tablet Take 1 tablet (20 mg total) by mouth 2 times daily. 01/31/21     isosorbide mononitrate (ISMO) 20 MG tablet Take 1 tablet (20 mg total) by mouth 2 times daily. 04/04/21     ondansetron (ZOFRAN-ODT) 4 MG disintegrating tablet DISSOLVE 1 TABLET BY MOUTH EVERY 4 HOURS AS NEEDED FOR VOMITING 09/22/20 09/22/21  Charlynne PanderYao, David Hsienta, MD  polyethylene glycol powder (GLYCOLAX/MIRALAX) 17 GM/SCOOP powder Take 17 g by mouth daily as needed for moderate constipation.    [provider]  potassium chloride  (KLOR-CON) 10 MEQ tablet Take 10 mEq by mouth every other day. 03/12/21   [provider]  potassium chloride SA (KLOR-CON) 20 MEQ tablet Take 1 tablet (20 mEq total) by mouth every other day for 14 days. 01/22/21 02/11/21  Cassandra Patel, [name], DO  potassium chloride SA (KLOR-CON) 20 MEQ tablet Take 1/2 tablet (10 mEq total) by mouth every other day 06/18/21     rosuvastatin (CRESTOR) 10 MG tablet Take 10 mg by mouth daily. 04/29/20   [provider]  rosuvastatin (CRESTOR) 10 MG tablet Take 1 tablet (10 mg total) by mouth nightly. 01/31/21     rosuvastatin (CRESTOR) 10 MG tablet Take 10 mg by mouth at bedtime. 01/31/21   [provider]      Allergies    Codeine    Review of Systems   Review of Systems  Constitutional:  Negative for chills, fatigue and fever.  HENT:  Negative for congestion.   Respiratory:  Negative for cough, chest tightness and shortness of breath.   Cardiovascular:  Negative for chest pain.  Gastrointestinal:  Negative for abdominal pain, constipation, diarrhea, nausea and vomiting.  Genitourinary:  Negative for dysuria and flank pain.  Musculoskeletal:  Negative for back pain, neck pain and neck stiffness.  Skin:  Negative for rash and wound.  Neurological:  Negative for weakness, light-headedness, numbness and headaches.  Psychiatric/Behavioral:  Negative for agitation.   All other systems reviewed and are negative.  Physical Exam Updated Vital Signs BP (!) 166/57 (BP Location: Right Arm)    Pulse 65    Temp 98.7 F (37.1 C) (Oral)    Resp 18    Ht 4\' 11"  (1.499 m)    Wt 44.9 kg    SpO2 96%    BMI 20.00 kg/m  Physical Exam Vitals and nursing note reviewed.  Constitutional:      General: She is not in acute distress.    Appearance: She is well-developed. She is not ill-appearing, toxic-appearing or diaphoretic.  HENT:     Head: Normocephalic and atraumatic.     Nose: No congestion.  Eyes:     Conjunctiva/sclera: Conjunctivae normal.   Cardiovascular:     Rate and Rhythm: Normal rate and regular rhythm.     Heart sounds: No murmur heard. Pulmonary:     Effort: Pulmonary effort is normal. No respiratory distress.     Breath sounds: Normal breath sounds. No wheezing, rhonchi or rales.  Chest:     Chest wall: No tenderness.  Abdominal:     General: Abdomen is flat.     Palpations: Abdomen is soft.     Tenderness: There is no abdominal tenderness. There is no right CVA tenderness, left CVA tenderness, guarding or rebound.  Musculoskeletal:  General: Tenderness present. No swelling.     Cervical back: Neck supple.     Left hip: Tenderness present.     Right lower leg: No edema.     Left lower leg: No edema.       Legs:     Comments: Normal pulse, strength, sensation distally in both legs.  Pain with manipulation and tenderness on the lateral superior anterior hip area.    Skin:    General: Skin is warm and dry.     Capillary Refill: Capillary refill takes less than 2 seconds.     Findings: No erythema or rash.  Neurological:     General: No focal deficit present.     Mental Status: She is alert. Mental status is at baseline.     Sensory: No sensory deficit.     Motor: No weakness.  Psychiatric:        Mood and Affect: Mood normal.    ED Results / Procedures / Treatments   Labs (all labs ordered are listed, but only abnormal results are displayed) Labs Reviewed - No data to display  EKG None  Radiology DG Hip Unilat W or Wo Pelvis 2-3 Views Left  Result Date: 09/14/2021 CLINICAL DATA:  Left hip pain post recent surgery. EXAM: DG HIP (WITH OR WITHOUT PELVIS) 2-3V LEFT COMPARISON:  February 2022 FINDINGS: Post 3 screw fixation of the left femoral neck. No evidence of acute fracture or lucency around the hardware. Osteoarthritic changes of the lumbosacral spine. IMPRESSION: 1. No acute fracture or dislocation identified about the left hip. 2. Post 3 screw fixation of the left femoral neck. Electronically  Signed   By: Ted Mcalpineobrinka  Dimitrova M.D.   On: 09/14/2021 15:42    Procedures Procedures    Medications Ordered in ED Medications  HYDROcodone-acetaminophen (NORCO/VICODIN) 5-325 MG per tablet 1 tablet (1 tablet Oral Given 09/14/21 1703)    ED Course/ Medical Decision Making/ A&P                           Medical Decision Making  Thornell SartoriusLaura Wadley is a 88 y.o. female with a past medical history significant for CHF with pacemaker, hypertension, hypercholesterolemia, arthritis, recent left hip fracture status postrepair with screws, and report of previous blood clots who presents with left hip pain.  According to family, she had a fall with hip surgery around Thanksgiving and then has been in rehab facility [facility] until last night at 5 PM when she was discharged home.  After getting home, family think she started "overexerting herself and doing too much" and did not want the tramadol for pain.  According to family, today she started having worsening pain in her left hip and has had pain with ambulation.  They gave her Tylenol and then gave her some tramadol but the pain was still severe prompting her to come in.  They deny any new trauma or new injuries.  Otherwise they report she has had no recent fevers, chills, cough congestion, nausea, vomiting, constipation, diarrhea, or urinary changes.  She denies any pain going down her leg and the leg swelling.  She denies any abdominal pain, back pain, or flank pain.  Pain is just in the left hip area.  On exam, lungs clear and chest nontender.  Abdomen nontender.  Calf and lower leg nontender.  Good pulses, strength, and sensation.  Patient had pain with manipulation and palpation in the anterior and superior lateral hip area  on the left.  Right side nontender.  X-rays were obtained in triage which did not show any evidence of new fracture or hardware abnormality.  Had discussion with patient and family about management.  Clinically I do suspect she overdid  it and the pain got to the point where the tramadol cannot bring it back down.  We discussed the possibility of DVT causing this however the pain is not in the typical location and the family already made decision that they would not anticoagulate given the DVTs seen previously.  Thus, we agreed to hold on ultrasound as it would not change management at this time.  We also discussed doing CT or even transfer for MRI for more advanced imaging to look for hardware abnormality but given the lack of new trauma and the description of her symptoms being similar but more severe we agreed to hold on this.  Family would prefer getting a dose of a stronger pain medicine here and in the prescription for similar pain medicine and then have her follow-up with her PCP.  Family does say they have Lidoderm patches to use but had not done so.  We will have them use her Lidoderm patches and will give a dose of Norco which she is tolerated before.  We will also give a short course of Norco to use instead of the tramadol for the next few days and family understands return precautions.  They noted questions or concerns and agreed with plan of care and patient was discharged in good condition with likely exacerbation of musculoskeletal pain after overuse after discharge from rehab facility.         Final Clinical Impression(s) / ED Diagnoses Final diagnoses:  Left hip pain    Rx / DC Orders ED Discharge Orders          Ordered    HYDROcodone-acetaminophen (NORCO/VICODIN) 5-325 MG tablet  Every 4 hours PRN        09/14/21 1718            Clinical Impression: 1. Left hip pain     Disposition: Discharge  Condition: Good  I have discussed the results, Dx and Tx plan with the pt(& family if present). He/she/they expressed understanding and agree(s) with the plan. Discharge instructions discussed at great length. Strict return precautions discussed and pt &/or family have verbalized understanding of the  instructions. No further questions at time of discharge.    New Prescriptions   HYDROCODONE-ACETAMINOPHEN (NORCO/VICODIN) 5-325 MG TABLET    Take 1 tablet by mouth every 4 (four) hours as needed.    Follow Up: [name], MD [address] [place] [address] [phone]     [facility] [place]Y DEPARTMENT [address] [id number] DG [place] [address] [phone]        Va Broadwell, Canary Brim, MD 09/14/21 1726

## 2021-09-15 ENCOUNTER — Other Ambulatory Visit (HOSPITAL_BASED_OUTPATIENT_CLINIC_OR_DEPARTMENT_OTHER): Payer: Self-pay

## 2021-09-22 ENCOUNTER — Other Ambulatory Visit (HOSPITAL_BASED_OUTPATIENT_CLINIC_OR_DEPARTMENT_OTHER): Payer: Self-pay

## 2021-09-22 MED ORDER — ROSUVASTATIN CALCIUM 10 MG PO TABS
ORAL_TABLET | ORAL | 3 refills | Status: AC
  Filled 2021-09-22: qty 90, 90d supply, fill #0
  Filled 2021-12-22: qty 90, 90d supply, fill #1

## 2021-09-22 MED ORDER — ISOSORBIDE MONONITRATE 20 MG PO TABS
ORAL_TABLET | ORAL | 3 refills | Status: AC
  Filled 2021-09-22: qty 180, 90d supply, fill #0
  Filled 2021-12-22: qty 180, 90d supply, fill #1

## 2021-09-22 MED ORDER — FUROSEMIDE 20 MG PO TABS
ORAL_TABLET | ORAL | 3 refills | Status: AC
  Filled 2021-09-22: qty 45, 90d supply, fill #0

## 2021-09-22 MED ORDER — DONEPEZIL HCL 10 MG PO TABS
ORAL_TABLET | ORAL | 3 refills | Status: AC
  Filled 2021-09-22: qty 90, 90d supply, fill #0
  Filled 2021-12-22: qty 90, 90d supply, fill #1

## 2021-09-22 MED ORDER — DILTIAZEM HCL 30 MG PO TABS
ORAL_TABLET | ORAL | 3 refills | Status: AC
  Filled 2021-09-22: qty 180, 90d supply, fill #0
  Filled 2022-02-23: qty 180, 90d supply, fill #1

## 2021-09-23 ENCOUNTER — Other Ambulatory Visit (HOSPITAL_BASED_OUTPATIENT_CLINIC_OR_DEPARTMENT_OTHER): Payer: Self-pay

## 2021-10-14 ENCOUNTER — Other Ambulatory Visit (HOSPITAL_BASED_OUTPATIENT_CLINIC_OR_DEPARTMENT_OTHER): Payer: Self-pay

## 2021-10-14 MED ORDER — TRAMADOL HCL 50 MG PO TABS
ORAL_TABLET | ORAL | 0 refills | Status: AC
  Filled 2021-10-14: qty 14, 7d supply, fill #0

## 2021-11-06 ENCOUNTER — Emergency Department (HOSPITAL_BASED_OUTPATIENT_CLINIC_OR_DEPARTMENT_OTHER): Payer: Medicare Other

## 2021-11-06 ENCOUNTER — Other Ambulatory Visit: Payer: Self-pay

## 2021-11-06 ENCOUNTER — Emergency Department (HOSPITAL_BASED_OUTPATIENT_CLINIC_OR_DEPARTMENT_OTHER)
Admission: EM | Admit: 2021-11-06 | Discharge: 2021-11-06 | Disposition: A | Discharge status: Home / Self Care | Payer: Medicare Other | Attending: Emergency Medicine | Admitting: Emergency Medicine

## 2021-11-06 ENCOUNTER — Encounter (HOSPITAL_BASED_OUTPATIENT_CLINIC_OR_DEPARTMENT_OTHER): Payer: Self-pay

## 2021-11-06 DIAGNOSIS — Z79899 Other long term (current) drug therapy: Secondary | ICD-10-CM | POA: Insufficient documentation

## 2021-11-06 DIAGNOSIS — R531 Weakness: Secondary | ICD-10-CM | POA: Insufficient documentation

## 2021-11-06 DIAGNOSIS — M791 Myalgia, unspecified site: Secondary | ICD-10-CM | POA: Insufficient documentation

## 2021-11-06 DIAGNOSIS — I509 Heart failure, unspecified: Secondary | ICD-10-CM | POA: Diagnosis not present

## 2021-11-06 DIAGNOSIS — M25551 Pain in right hip: Secondary | ICD-10-CM | POA: Insufficient documentation

## 2021-11-06 DIAGNOSIS — Z950 Presence of cardiac pacemaker: Secondary | ICD-10-CM | POA: Diagnosis not present

## 2021-11-06 DIAGNOSIS — Z7982 Long term (current) use of aspirin: Secondary | ICD-10-CM | POA: Diagnosis not present

## 2021-11-06 DIAGNOSIS — I110 Hypertensive heart disease with heart failure: Secondary | ICD-10-CM | POA: Insufficient documentation

## 2021-11-06 DIAGNOSIS — Z20822 Contact with and (suspected) exposure to covid-19: Secondary | ICD-10-CM | POA: Insufficient documentation

## 2021-11-06 LAB — URINALYSIS, MICROSCOPIC (REFLEX)
RBC / HPF: NONE SEEN RBC/hpf (ref 0–5)
WBC, UA: NONE SEEN WBC/hpf (ref 0–5)

## 2021-11-06 LAB — CBC WITH DIFFERENTIAL/PLATELET
Abs Immature Granulocytes: 0.01 10*3/uL (ref 0.00–0.07)
Basophils Absolute: 0.1 10*3/uL (ref 0.0–0.1)
Basophils Relative: 1 %
Eosinophils Absolute: 0.3 10*3/uL (ref 0.0–0.5)
Eosinophils Relative: 6 %
HCT: 39.2 % (ref 36.0–46.0)
Hemoglobin: 12.6 g/dL (ref 12.0–15.0)
Immature Granulocytes: 0 %
Lymphocytes Relative: 34 %
Lymphs Abs: 1.6 10*3/uL (ref 0.7–4.0)
MCH: 32.6 pg (ref 26.0–34.0)
MCHC: 32.1 g/dL (ref 30.0–36.0)
MCV: 101.3 fL — ABNORMAL HIGH (ref 80.0–100.0)
Monocytes Absolute: 0.5 10*3/uL (ref 0.1–1.0)
Monocytes Relative: 11 %
Neutro Abs: 2.2 10*3/uL (ref 1.7–7.7)
Neutrophils Relative %: 48 %
Platelets: 179 10*3/uL (ref 150–400)
RBC: 3.87 MIL/uL (ref 3.87–5.11)
RDW: 13.7 % (ref 11.5–15.5)
WBC: 4.5 10*3/uL (ref 4.0–10.5)
nRBC: 0 % (ref 0.0–0.2)

## 2021-11-06 LAB — URINALYSIS, ROUTINE W REFLEX MICROSCOPIC
Bilirubin Urine: NEGATIVE
Glucose, UA: NEGATIVE mg/dL
Ketones, ur: NEGATIVE mg/dL
Leukocytes,Ua: NEGATIVE
Nitrite: NEGATIVE
Protein, ur: NEGATIVE mg/dL
Specific Gravity, Urine: 1.025 (ref 1.005–1.030)
pH: 6 (ref 5.0–8.0)

## 2021-11-06 LAB — COMPREHENSIVE METABOLIC PANEL
ALT: 12 U/L (ref 0–44)
AST: 21 U/L (ref 15–41)
Albumin: 4.2 g/dL (ref 3.5–5.0)
Alkaline Phosphatase: 73 U/L (ref 38–126)
Anion gap: 7 (ref 5–15)
BUN: 29 mg/dL — ABNORMAL HIGH (ref 8–23)
CO2: 27 mmol/L (ref 22–32)
Calcium: 10.4 mg/dL — ABNORMAL HIGH (ref 8.9–10.3)
Chloride: 111 mmol/L (ref 98–111)
Creatinine, Ser: 0.98 mg/dL (ref 0.44–1.00)
GFR, Estimated: 52 mL/min — ABNORMAL LOW (ref >60)
Glucose, Bld: 71 mg/dL (ref 70–99)
Potassium: 3.9 mmol/L (ref 3.5–5.1)
Sodium: 145 mmol/L (ref 135–145)
Total Bilirubin: 0.5 mg/dL (ref 0.3–1.2)
Total Protein: 7.1 g/dL (ref 6.5–8.1)

## 2021-11-06 LAB — RESP PANEL BY RT-PCR (FLU A&B, COVID) ARPGX2
Influenza A by PCR: NEGATIVE
Influenza B by PCR: NEGATIVE
SARS Coronavirus 2 by RT PCR: NEGATIVE

## 2021-11-06 MED ORDER — ISOSORBIDE DINITRATE 20 MG PO TABS: 20.0000 mg | ORAL_TABLET | Freq: Once | ORAL | Status: DC

## 2021-11-06 MED ORDER — TRAMADOL HCL 50 MG PO TABS
50.0000 mg | ORAL_TABLET | Freq: Four times a day (QID) | ORAL | 0 refills | Status: DC | PRN
  Filled 2021-11-06: qty 15, 4d supply, fill #0

## 2021-11-06 MED ORDER — TRAMADOL HCL 50 MG PO TABS
50.0000 mg | ORAL_TABLET | Freq: Once | ORAL | Status: AC
  Administered 2021-11-06: 16:00:00 50 mg via ORAL
  Filled 2021-11-06: qty 1

## 2021-11-06 MED ORDER — DILTIAZEM HCL 30 MG PO TABS
30.0000 mg | ORAL_TABLET | Freq: Once | ORAL | Status: AC
Start: 2021-11-06 — End: 2021-11-06
  Administered 2021-11-06: 20:00:00 30 mg via ORAL
  Filled 2021-11-06: qty 1

## 2021-11-06 NOTE — Discharge Instructions (Addendum)
Take the isosorbide tonight and the normal blood pressure medications in the morning and keep an eye on the blood pressure.  If it continues to run high she should follow-up with her regular doctor.  Also a prescription for pain medicine was sent to her pharmacy to use as needed.  Unclear what caused this episode today in particular all the lab work is very reassuring.  Kidneys are normal, no sign of infection.  COVID was negative.  If she develops a fever, vomiting, chest pain, shortness of breath or any new symptoms please return to the emergency room. ?

## 2021-11-06 NOTE — ED Notes (Signed)
XR at bedside

## 2021-11-06 NOTE — ED Provider Notes (Signed)
[facility] EMERGENCY DEPARTMENT Provider Note   CSN: [id number] Arrival date & time: 11/06/21  1540     History  Chief Complaint  Patient presents with   Hip Pain    Cassandra Patel is a 88 y.o. female.  Patient is a 88 year old female with multiple medical problems including CHF, sick sinus syndrome status post pacemaker, hypertension, high cholesterol, fall in December with pending of her left hip who had been taking tramadol for the pain but had discontinued that approximately 1 week ago who is currently living with her daughter and coming in today with several complaints.  Daughter reports that this morning she seemed fine but this afternoon when her occupational therapist was supposed to come around for patient became so generally weak she was not even able to stand up or use her walker.  She was complaining of pain all over and pain in her right hip going down her leg.  She felt very cold but has not been aware that she has been running a fever.  She denies having any chest pain, abdominal pain or shortness of breath.  She denies any diarrhea or nausea.  Daughter reports that she is always coughing up a lot of spit and clear sputum for months now and does not feel like that is changed at all.  Other than discontinuing the tramadol last week there is been no other medication changes.  She has been compliant.  No recent falls.  The history is provided by the patient, medical records and a relative.  Hip Pain      Home Medications Prior to Admission medications   Medication Sig Start Date End Date Taking? Authorizing Provider  traMADol (ULTRAM) 50 MG tablet Take 1 tablet (50 mg total) by mouth every 6 (six) hours as needed. 11/06/21  Yes Shealee Yordy, Alphonzo Lemmings, MD  acetaminophen (TYLENOL) 500 MG tablet Take 500 mg by mouth daily as needed for pain.    [provider]  amLODipine (NORVASC) 5 MG tablet Take 1 tablet (5 mg total) by mouth daily 02/03/21     aspirin 81 MG EC  tablet Take 1 tablet (81 mg total) by mouth daily.    [provider]  buprenorphine (BUTRANS) 5 MCG/HR PTWK Place 1 patch onto the skin every 7 days for 28 days. 04/10/21     buprenorphine (BUTRANS) 7.5 MCG/HR Place 1 patch onto the skin every 7 days for 28 days. 06/05/21     cephALEXin (KEFLEX) 500 MG capsule Take 1 capsule (500 mg total) by mouth 2 times daily for 7 days. 03/04/21     cloNIDine (CATAPRES) 0.1 MG tablet Take 0.1 mg by mouth as needed. For SBP >160    [provider]  clopidogrel (PLAVIX) 75 MG tablet Take 1 tablet (75 mg total) by mouth daily. Start date: 12/31/2020 Stop date: 01/19/2021 12/30/20     dextromethorphan-guaiFENesin (MUCINEX DM) 30-600 MG 12hr tablet Take 1 tablet by mouth 2 (two) times daily.    [provider]  diltiazem (CARDIZEM) 120 MG tablet TAKE 1/2 TABLET BY MOUTH DAILY Patient taking differently: Take 60 mg by mouth in the morning and at bedtime. 07/02/20 07/31/21  [name], MD  diltiazem (CARDIZEM) 30 MG tablet Take 30 mg by mouth 2 (two) times daily. 03/08/21   [provider]  diltiazem (CARDIZEM) 30 MG tablet Take 1 tablet (30 mg total) by mouth in the morning and at bedtime. 09/22/21     donepezil (ARICEPT) 10 MG tablet TAKE 1  TABLET (10 MG TOTAL) BY MOUTH NIGHTLY FOR 30 DAYS. Patient taking differently: Take 10 mg by mouth at bedtime. 10/10/20 10/10/21  Leimberger, Veneta Pentonavid Neil, PA-C  donepezil (ARICEPT) 10 MG tablet Take 1 tablet (10 mg total) by mouth nightly. 01/31/21     donepezil (ARICEPT) 10 MG tablet Take 10 mg by mouth at bedtime. 01/31/21   [provider]  donepezil (ARICEPT) 10 MG tablet Take 1 tablet (10 mg total) by mouth nightly. 09/22/21     ferrous sulfate 324 MG TBEC Take 324 mg by mouth every other day.    [provider]  fluticasone (FLONASE) 50 MCG/ACT nasal spray Place 1 spray into both nostrils daily as needed for allergies. 11/27/20   [provider]  furosemide (LASIX) 20 MG tablet  Take 20 mg by mouth every other day. 02/03/21   [provider]  furosemide (LASIX) 20 MG tablet Take 1 tablet (20 mg total) by mouth every other day. 09/22/21     furosemide (LASIX) 40 MG tablet Take 40 mg by mouth every other day. 03/31/20   [provider]  HYDROcodone-acetaminophen (NORCO/VICODIN) 5-325 MG tablet Take 1 tablet by mouth every 4 (four) hours as needed. 09/14/21   Tegeler, Canary Brimhristopher J, MD  isosorbide mononitrate (ISMO) 20 MG tablet Take 20 mg by mouth 2 (two) times daily. 04/29/20   [provider]  isosorbide mononitrate (ISMO) 20 MG tablet Take 1 tablet (20 mg total) by mouth 2 times daily. 01/31/21     isosorbide mononitrate (ISMO) 20 MG tablet Take 1 tablet (20 mg total) by mouth 2 times daily. 04/04/21     isosorbide mononitrate (ISMO) 20 MG tablet Take 1 tablet (20 mg total) by mouth 2 times daily. 09/22/21     polyethylene glycol powder (GLYCOLAX/MIRALAX) 17 GM/SCOOP powder Take 17 g by mouth daily as needed for moderate constipation.    [provider]  potassium chloride (KLOR-CON) 10 MEQ tablet Take 10 mEq by mouth every other day. 03/12/21   [provider]  potassium chloride SA (KLOR-CON) 20 MEQ tablet Take 1 tablet (20 mEq total) by mouth every other day for 14 days. 01/22/21 02/11/21  [name], [name], DO  potassium chloride SA (KLOR-CON) 20 MEQ tablet Take 1/2 tablet (10 mEq total) by mouth every other day 06/18/21     rosuvastatin (CRESTOR) 10 MG tablet Take 10 mg by mouth daily. 04/29/20   [provider]  rosuvastatin (CRESTOR) 10 MG tablet Take 1 tablet (10 mg total) by mouth nightly. 01/31/21     rosuvastatin (CRESTOR) 10 MG tablet Take 10 mg by mouth at bedtime. 01/31/21   [provider]  rosuvastatin (CRESTOR) 10 MG tablet Take 1 tablet (10 mg total) by mouth nightly. 09/22/21     traMADol (ULTRAM) 50 MG tablet Take 1 tablet (50 mg total) by mouth 2 (two)  times daily as needed for up to 7 days. 10/14/21          Allergies    Codeine    Review of Systems   Review of Systems  Physical Exam Updated Vital Signs BP (!) 196/68    Pulse (!) 54    Temp 99 F (37.2 C) (Rectal)    Resp 16    Ht 4\' 11"  (1.499 m)    Wt 44.5 kg    SpO2 99%    BMI 19.79 kg/m  Physical Exam Vitals and nursing note reviewed.  Constitutional:      General: She is not  in acute distress.    Appearance: She is well-developed.  HENT:     Head: Normocephalic and atraumatic.  Eyes:     Pupils: Pupils are equal, round, and reactive to light.  Cardiovascular:     Rate and Rhythm: Normal rate and regular rhythm.     Pulses: Normal pulses.     Heart sounds: Normal heart sounds. No murmur heard.   No friction rub.  Pulmonary:     Effort: Pulmonary effort is normal.     Breath sounds: Normal breath sounds. No wheezing or rales.     Comments: Pacemaker present in the left upper chest Abdominal:     General: Bowel sounds are normal. There is no distension.     Palpations: Abdomen is soft.     Tenderness: There is no abdominal tenderness. There is no guarding or rebound.  Musculoskeletal:        General: Normal range of motion.     Lumbar back: Tenderness present.     Right hip: Tenderness and bony tenderness present.     Right lower leg: No edema.     Left lower leg: No edema.     Comments: No edema  Skin:    General: Skin is warm and dry.     Findings: No rash.  Neurological:     Mental Status: She is alert and oriented to person, place, and time. Mental status is at baseline.     Cranial Nerves: No cranial nerve deficit.     Sensory: No sensory deficit.     Motor: No weakness.  Psychiatric:        Mood and Affect: Mood normal.        Behavior: Behavior normal.    ED Results / Procedures / Treatments   Labs (all labs ordered are listed, but only abnormal results are displayed) Labs Reviewed  URINALYSIS, ROUTINE W REFLEX MICROSCOPIC - Abnormal; Notable for the following components:      Result Value   Hgb  urine dipstick TRACE (*)    All other components within normal limits  CBC WITH DIFFERENTIAL/PLATELET - Abnormal; Notable for the following components:   MCV 101.3 (*)    All other components within normal limits  COMPREHENSIVE METABOLIC PANEL - Abnormal; Notable for the following components:   BUN 29 (*)    Calcium 10.4 (*)    GFR, Estimated 52 (*)    All other components within normal limits  URINALYSIS, MICROSCOPIC (REFLEX) - Abnormal; Notable for the following components:   Bacteria, UA RARE (*)    All other components within normal limits  RESP PANEL BY RT-PCR (FLU A&B, COVID) [id number]    EKG EKG Interpretation  Date/Time:  Thursday November 06 2021 16:31:20 EST Ventricular Rate:  60 PR Interval:  173 QRS Duration: 154 QT Interval:  519 QTC Calculation: 519 R Axis:   -66 Text Interpretation: Sinus rhythm Left bundle branch block No significant change since last tracing Confirmed by Gwyneth Sprout ([id number]) on 11/06/2021 4:34:00 PM  Radiology DG Chest Port 1 View  Result Date: 11/06/2021 CLINICAL DATA:  Cough. EXAM: PORTABLE CHEST 1 VIEW COMPARISON:  Chest x-ray 07/31/2021 FINDINGS: Left-sided pacemaker is unchanged in position. The heart is enlarged, unchanged. There are atherosclerotic calcifications of the aorta. Lungs and costophrenic angles are clear. No pneumothorax. Degenerative changes affect both shoulders. IMPRESSION: 1. No acute cardiopulmonary process. 2. Stable cardiomegaly. Electronically Signed   By: Darliss Cheney M.D.   On: 11/06/2021 16:25   DG Hip  Unilat W or Wo Pelvis 2-3 Views Right  Result Date: 11/06/2021 CLINICAL DATA:  Right hip pain. EXAM: DG HIP (WITH OR WITHOUT PELVIS) 2-3V RIGHT COMPARISON:  12/28/2020 from [facility] FINDINGS: There is no evidence of hip fracture or dislocation. There is no evidence of hip joint arthropathy. Enthesopathic changes are seen involving the greater trochanter. Generalized osteopenia noted. Severe lower lumbar spine  degenerative changes are seen. Peripheral vascular calcification noted. Internal fixation screws seen in the left hip. IMPRESSION: No acute findings. Electronically Signed   By: Danae Orleans M.D.   On: 11/06/2021 16:29    Procedures Procedures    Medications Ordered in ED Medications  traMADol (ULTRAM) tablet 50 mg (50 mg Oral Given 11/06/21 1621)  diltiazem (CARDIZEM) tablet 30 mg (30 mg Oral Given 11/06/21 1950)    ED Course/ Medical Decision Making/ A&P                           Medical Decision Making Amount and/or Complexity of Data Reviewed Independent Historian: caregiver External Data Reviewed: notes. Labs: ordered. Decision-making details documented in ED Course. Radiology: ordered and independent interpretation performed. Decision-making details documented in ED Course. ECG/medicine tests: ordered and independent interpretation performed. Decision-making details documented in ED Course.  Risk Prescription drug management.   Elderly female with multiple medical problems presenting today reporting that she just feels unwell and sick.  Started this afternoon daughter reports significant generalized weakness to where she was not even able to walk with her walker and had to be wheeled to the car someone had to get her out of the car.  She is complaining of diffuse body pain but does report it seems to be worse in her right hip.  No recent falls.  Patient is denying chest pain or abdominal pain.  Uncertain patient's etiology.  Concern for early infectious etiology will evaluate for that.  Lower suspicion for cardiac abnormality.  Also could be chronic pain within the last week did discontinue tramadol.  9:55 PM I independently interpreted patient's labs and EKG and her EKG today shows a persistent paced rhythm with no acute changes, CBC, CMP, UA and COVID testing are all negative for acute findings.  I independently visualized and interpreted patient's chest x-ray and hip films which  show no acute findings.  Patient was given 1 dose of tramadol which she reports she has tolerated at home before and the other medications seem to be too strong.  On repeat evaluation patient is feeling better.  She has been persistently hypertensive here and was given a dose of her Cardizem which she takes in the evening at home but we do not have isosorbide.  Blood pressure improved to 187/70.  Patient was able to stand and walk here with her walker and daughter reported she is at her baseline.  Low suspicion for stroke at this time.  However did discuss watching her blood pressure regularly, taking her isosorbide when she gets home tonight and continuing her home blood pressure medications.  If her blood pressure starts becoming persistently elevated she will need to follow-up with her PCP.  Daughter and patient do not have any further questions.  She is eager to go home.  She was given a prescription for tramadol to use as needed if she develops pain.  She was given strict return precautions.  No social determinants affecting her discharge today.        Final Clinical Impression(s) /  ED Diagnoses Final diagnoses:  Myalgia    Rx / DC Orders ED Discharge Orders          Ordered    traMADol (ULTRAM) 50 MG tablet  Every 6 hours PRN        11/06/21 2121              Gwyneth Sprout, MD 11/06/21 2157

## 2021-11-06 NOTE — ED Triage Notes (Signed)
Pt arrives with daughter with c/o hip pain X2 days, pain radiates into right leg and right side. Denies any falls or injury.  ?

## 2021-11-07 ENCOUNTER — Other Ambulatory Visit (HOSPITAL_BASED_OUTPATIENT_CLINIC_OR_DEPARTMENT_OTHER): Payer: Self-pay

## 2021-11-11 ENCOUNTER — Other Ambulatory Visit (HOSPITAL_BASED_OUTPATIENT_CLINIC_OR_DEPARTMENT_OTHER): Payer: Self-pay

## 2021-11-11 MED ORDER — TRAMADOL HCL 50 MG PO TABS
50.0000 mg | ORAL_TABLET | Freq: Three times a day (TID) | ORAL | 0 refills | Status: AC | PRN
  Filled 2021-11-11 – 2021-11-28 (×2): qty 15, 5d supply, fill #0

## 2021-11-13 ENCOUNTER — Other Ambulatory Visit (HOSPITAL_BASED_OUTPATIENT_CLINIC_OR_DEPARTMENT_OTHER): Payer: Self-pay

## 2021-11-20 ENCOUNTER — Other Ambulatory Visit (HOSPITAL_BASED_OUTPATIENT_CLINIC_OR_DEPARTMENT_OTHER): Payer: Self-pay

## 2021-11-28 ENCOUNTER — Other Ambulatory Visit (HOSPITAL_BASED_OUTPATIENT_CLINIC_OR_DEPARTMENT_OTHER): Payer: Self-pay

## 2021-12-22 ENCOUNTER — Other Ambulatory Visit (HOSPITAL_BASED_OUTPATIENT_CLINIC_OR_DEPARTMENT_OTHER): Payer: Self-pay

## 2021-12-23 ENCOUNTER — Other Ambulatory Visit (HOSPITAL_BASED_OUTPATIENT_CLINIC_OR_DEPARTMENT_OTHER): Payer: Self-pay

## 2021-12-24 ENCOUNTER — Other Ambulatory Visit: Payer: Self-pay

## 2021-12-24 ENCOUNTER — Emergency Department (HOSPITAL_BASED_OUTPATIENT_CLINIC_OR_DEPARTMENT_OTHER)
Admission: EM | Admit: 2021-12-24 | Discharge: 2021-12-25 | Disposition: A | Discharge status: Home / Self Care | Payer: Medicare Other | Attending: Emergency Medicine | Admitting: Emergency Medicine

## 2021-12-24 ENCOUNTER — Encounter (HOSPITAL_BASED_OUTPATIENT_CLINIC_OR_DEPARTMENT_OTHER): Payer: Self-pay

## 2021-12-24 ENCOUNTER — Emergency Department (HOSPITAL_BASED_OUTPATIENT_CLINIC_OR_DEPARTMENT_OTHER): Payer: Medicare Other

## 2021-12-24 DIAGNOSIS — I10 Essential (primary) hypertension: Secondary | ICD-10-CM

## 2021-12-24 DIAGNOSIS — Z79899 Other long term (current) drug therapy: Secondary | ICD-10-CM | POA: Diagnosis not present

## 2021-12-24 DIAGNOSIS — R630 Anorexia: Secondary | ICD-10-CM | POA: Diagnosis not present

## 2021-12-24 DIAGNOSIS — Z7902 Long term (current) use of antithrombotics/antiplatelets: Secondary | ICD-10-CM | POA: Insufficient documentation

## 2021-12-24 DIAGNOSIS — R079 Chest pain, unspecified: Secondary | ICD-10-CM | POA: Diagnosis present

## 2021-12-24 DIAGNOSIS — I110 Hypertensive heart disease with heart failure: Secondary | ICD-10-CM | POA: Insufficient documentation

## 2021-12-24 DIAGNOSIS — R110 Nausea: Secondary | ICD-10-CM | POA: Diagnosis not present

## 2021-12-24 DIAGNOSIS — I509 Heart failure, unspecified: Secondary | ICD-10-CM | POA: Diagnosis not present

## 2021-12-24 DIAGNOSIS — Z7982 Long term (current) use of aspirin: Secondary | ICD-10-CM | POA: Insufficient documentation

## 2021-12-24 DIAGNOSIS — R0602 Shortness of breath: Secondary | ICD-10-CM | POA: Diagnosis not present

## 2021-12-24 DIAGNOSIS — R072 Precordial pain: Secondary | ICD-10-CM | POA: Diagnosis not present

## 2021-12-24 DIAGNOSIS — Z950 Presence of cardiac pacemaker: Secondary | ICD-10-CM | POA: Insufficient documentation

## 2021-12-24 LAB — CBC WITH DIFFERENTIAL/PLATELET
Abs Immature Granulocytes: 0.01 10*3/uL (ref 0.00–0.07)
Basophils Absolute: 0.1 10*3/uL (ref 0.0–0.1)
Basophils Relative: 1 %
Eosinophils Absolute: 0.3 10*3/uL (ref 0.0–0.5)
Eosinophils Relative: 8 %
HCT: 37.9 % (ref 36.0–46.0)
Hemoglobin: 12.3 g/dL (ref 12.0–15.0)
Immature Granulocytes: 0 %
Lymphocytes Relative: 39 %
Lymphs Abs: 1.8 10*3/uL (ref 0.7–4.0)
MCH: 32.5 pg (ref 26.0–34.0)
MCHC: 32.5 g/dL (ref 30.0–36.0)
MCV: 100 fL (ref 80.0–100.0)
Monocytes Absolute: 0.4 10*3/uL (ref 0.1–1.0)
Monocytes Relative: 8 %
Neutro Abs: 2 10*3/uL (ref 1.7–7.7)
Neutrophils Relative %: 44 %
Platelets: 172 10*3/uL (ref 150–400)
RBC: 3.79 MIL/uL — ABNORMAL LOW (ref 3.87–5.11)
RDW: 14.1 % (ref 11.5–15.5)
WBC: 4.5 10*3/uL (ref 4.0–10.5)
nRBC: 0 % (ref 0.0–0.2)

## 2021-12-24 LAB — BASIC METABOLIC PANEL
Anion gap: 6 (ref 5–15)
BUN: 26 mg/dL — ABNORMAL HIGH (ref 8–23)
CO2: 26 mmol/L (ref 22–32)
Calcium: 10.3 mg/dL (ref 8.9–10.3)
Chloride: 111 mmol/L (ref 98–111)
Creatinine, Ser: 0.94 mg/dL (ref 0.44–1.00)
GFR, Estimated: 55 mL/min — ABNORMAL LOW (ref >60)
Glucose, Bld: 89 mg/dL (ref 70–99)
Potassium: 3.9 mmol/L (ref 3.5–5.1)
Sodium: 143 mmol/L (ref 135–145)

## 2021-12-24 LAB — TROPONIN I (HIGH SENSITIVITY)
Troponin I (High Sensitivity): 10 ng/L (ref <18)
Troponin I (High Sensitivity): 7 ng/L (ref <18)

## 2021-12-24 NOTE — ED Triage Notes (Signed)
Pt presents with a sharp, stabbing CP that does not radiate. Pt states the pain is intermittent. Pt had nausea, ShOB, and diaphoresis at onset.  ?

## 2021-12-24 NOTE — Discharge Instructions (Signed)
Make an appointment for her to follow-up with her cardiologist at [facility].  For blood pressure check and to make them aware of this chest pain episode.  Return for any new or worse symptoms. ?

## 2021-12-24 NOTE — ED Provider Notes (Signed)
?[place] EMERGENCY DEPARTMENT ?Provider Note ? ? ?CSN: [id number] ?Arrival date & time: 12/24/21  2045 ? ?  ? ?History ? ?Chief Complaint  ?Patient presents with  ? Chest Pain  ? ? ?Cassandra Patel is a 88 y.o. female. ? ?Patient here with her daughter who she lives with.  Patient has a little bit of memory problems.  There was complaint of chest pain yesterday.  Sounds as if may be lasting 5 minutes unable to tell how frequently it occurred.  Came back again today was intermittent.  Resolved somewhere between 2100-2130.  They reported some nausea shortness of breath and diaphoresis at onset.  Patient states she has no chest pain at this point in time at all.  And that she feels fine.  Temperature was 98.1 respirations 24 blood pressure 185/151.  But it has improved to 165/59.  Patient had not taken her blood pressure medicines.  Heart rate was 66.  EKG showed it was a paced rhythm.  Patient is on diltiazem.  Best we can tell the chest discomfort sharp in nature intermittent and was substernal area. ? ?Past medical history significant that she is followed by cardiology at [place] many years ago at sick sinus syndrome.  Has had a pacemaker since that time.  Patient is on Plavix.  Patient has a history of congestive heart failure pacemaker hypertension high cholesterol and the daughter says that many years ago she had 2 stents placed.  Other surgical history significant for appendectomy abdominal hysterectomy. ? ? ?  ? ?Home Medications ?Prior to Admission medications   ?Medication Sig Start Date End Date Taking? Authorizing Provider  ?acetaminophen (TYLENOL) 500 MG tablet Take 500 mg by mouth daily as needed for pain.    [provider]  ?amLODipine (NORVASC) 5 MG tablet Take 1 tablet (5 mg total) by mouth daily 02/03/21     ?aspirin 81 MG EC tablet Take 1 tablet (81 mg total) by mouth daily.    [provider]  ?buprenorphine (BUTRANS) 5 MCG/HR PTWK Place 1 patch onto the skin  every 7 days for 28 days. 04/10/21     ?buprenorphine (BUTRANS) 7.5 MCG/HR Place 1 patch onto the skin every 7 days for 28 days. 06/05/21     ?cephALEXin (KEFLEX) 500 MG capsule Take 1 capsule (500 mg total) by mouth 2 times daily for 7 days. 03/04/21     ?cloNIDine (CATAPRES) 0.1 MG tablet Take 0.1 mg by mouth as needed. For SBP >160    [provider]  ?clopidogrel (PLAVIX) 75 MG tablet Take 1 tablet (75 mg total) by mouth daily. Start date: 12/31/2020 Stop date: 01/19/2021 12/30/20     ?dextromethorphan-guaiFENesin (MUCINEX DM) 30-600 MG 12hr tablet Take 1 tablet by mouth 2 (two) times daily.    [provider]  ?diltiazem (CARDIZEM) 120 MG tablet TAKE 1/2 TABLET BY MOUTH DAILY ?Patient taking differently: Take 60 mg by mouth in the morning and at bedtime. 07/02/20 07/31/21  Glendon Axe, MD  ?diltiazem (CARDIZEM) 30 MG tablet Take 30 mg by mouth 2 (two) times daily. 03/08/21   [provider]  ?diltiazem (CARDIZEM) 30 MG tablet Take 1 tablet (30 mg total) by mouth in the morning and at bedtime. 09/22/21     ?donepezil (ARICEPT) 10 MG tablet TAKE 1 TABLET (10 MG TOTAL) BY MOUTH NIGHTLY FOR 30 DAYS. ?Patient taking differently: Take 10 mg by mouth at bedtime. 10/10/20 10/10/21  Leimberger, Javier Glazier, PA-C  ?donepezil (ARICEPT) 10  MG tablet Take 1 tablet (10 mg total) by mouth nightly. 01/31/21     ?donepezil (ARICEPT) 10 MG tablet Take 10 mg by mouth at bedtime. 01/31/21   [provider]  ?donepezil (ARICEPT) 10 MG tablet Take 1 tablet (10 mg total) by mouth nightly. 09/22/21     ?ferrous sulfate 324 MG TBEC Take 324 mg by mouth every other day.    [provider]  ?fluticasone (FLONASE) 50 MCG/ACT nasal spray Place 1 spray into both nostrils daily as needed for allergies. 11/27/20   [provider]  ?furosemide (LASIX) 20 MG tablet Take 20 mg by mouth every other day. 02/03/21   [provider]  ?furosemide (LASIX) 20 MG tablet Take 1 tablet (20 mg total) by mouth every  other day. 09/22/21     ?furosemide (LASIX) 40 MG tablet Take 40 mg by mouth every other day. 03/31/20   [provider]  ?HYDROcodone-acetaminophen (NORCO/VICODIN) 5-325 MG tablet Take 1 tablet by mouth every 4 (four) hours as needed. 09/14/21   Tegeler, Gwenyth Allegra, MD  ?isosorbide mononitrate (ISMO) 20 MG tablet Take 20 mg by mouth 2 (two) times daily. 04/29/20   [provider]  ?isosorbide mononitrate (ISMO) 20 MG tablet Take 1 tablet (20 mg total) by mouth 2 times daily. 01/31/21     ?isosorbide mononitrate (ISMO) 20 MG tablet Take 1 tablet (20 mg total) by mouth 2 times daily. 04/04/21     ?isosorbide mononitrate (ISMO) 20 MG tablet Take 1 tablet (20 mg total) by mouth 2 times daily. 09/22/21     ?polyethylene glycol powder (GLYCOLAX/MIRALAX) 17 GM/SCOOP powder Take 17 g by mouth daily as needed for moderate constipation.    [provider]  ?potassium chloride (KLOR-CON) 10 MEQ tablet Take 10 mEq by mouth every other day. 03/12/21   [provider]  ?potassium chloride SA (KLOR-CON) 20 MEQ tablet Take 1 tablet (20 mEq total) by mouth every other day for 14 days. 01/22/21 02/11/21  Kayleen Memos, DO  ?potassium chloride SA (KLOR-CON) 20 MEQ tablet Take 1/2 tablet (10 mEq total) by mouth every other day 06/18/21     ?rosuvastatin (CRESTOR) 10 MG tablet Take 10 mg by mouth daily. 04/29/20   [provider]  ?rosuvastatin (CRESTOR) 10 MG tablet Take 1 tablet (10 mg total) by mouth nightly. 01/31/21     ?rosuvastatin (CRESTOR) 10 MG tablet Take 10 mg by mouth at bedtime. 01/31/21   [provider]  ?rosuvastatin (CRESTOR) 10 MG tablet Take 1 tablet (10 mg total) by mouth nightly. 09/22/21     ?traMADol (ULTRAM) 50 MG tablet Take 1 tablet (50 mg total) by mouth 2 (two)  times daily as needed for up to 7 days. 10/14/21     ?traMADol (ULTRAM) 50 MG tablet Take 1 tablet (50 mg total) by mouth every 8 (eight) hours as needed for up to 5 days. 11/11/21     ?   ? ?Allergies     ?Codeine   ? ?Review of Systems   ?Review of Systems  ?Constitutional:  Positive for diaphoresis. Negative for chills and fever.  ?HENT:  Negative for ear pain and sore throat.   ?Eyes:  Negative for pain and visual disturbance.  ?Respiratory:  Positive for shortness of breath. Negative for cough.   ?Cardiovascular:  Positive for chest pain. Negative for palpitations.  ?Gastrointestinal:  Positive for nausea. Negative for abdominal pain and vomiting.  ?Genitourinary:  Negative for dysuria and hematuria.  ?Musculoskeletal:  Negative for arthralgias and back pain.  ?Skin:  Negative for color change and rash.  ?Neurological:  Negative for seizures and syncope.  ?All other systems reviewed and are negative. ? ?Physical Exam ?Updated Vital Signs ?BP (!) 165/59   Pulse (!) 54   Temp 98.1 ?F (36.7 ?C) (Oral)   Resp (!) 22   Ht 1.803 m (_0 )   Wt 44.5 kg   SpO2 96%   BMI 13.67 kg/m?  ?Physical Exam ?Vitals and nursing note reviewed.  ?Constitutional:   ?   General: She is not in acute distress. ?   Appearance: Normal appearance. She is well-developed.  ?HENT:  ?   Head: Normocephalic and atraumatic.  ?Eyes:  ?   Conjunctiva/sclera: Conjunctivae normal.  ?   Pupils: Pupils are equal, round, and reactive to light.  ?Cardiovascular:  ?   Rate and Rhythm: Normal rate and regular rhythm.  ?   Heart sounds: No murmur heard. ?Pulmonary:  ?   Effort: Pulmonary effort is normal. No respiratory distress.  ?   Breath sounds: Normal breath sounds. No wheezing, rhonchi or rales.  ?Abdominal:  ?   Palpations: Abdomen is soft.  ?   Tenderness: There is no abdominal tenderness.  ?Musculoskeletal:     ?   General: No swelling.  ?   Cervical back: Neck supple.  ?   Right lower leg: No edema.  ?   Left lower leg: No edema.  ?Skin: ?   General: Skin is warm and dry.  ?   Capillary Refill: Capillary refill takes less than 2 seconds.  ?Neurological:  ?   General: No focal deficit present.  ?   Mental Status: She is alert. Mental  status is at baseline.  ?Psychiatric:     ?   Mood and Affect: Mood normal.  ? ? ?ED Results / Procedures / Treatments   ?Labs ?(all labs ordered are listed, but only abnormal results are displayed) ?Labs Reviewed  ?BASIC MET

## 2021-12-24 NOTE — ED Notes (Signed)
ED Provider at bedside. 

## 2021-12-24 NOTE — ED Notes (Signed)
Radiology at bedside for portable xray.

## 2022-02-03 ENCOUNTER — Other Ambulatory Visit (HOSPITAL_BASED_OUTPATIENT_CLINIC_OR_DEPARTMENT_OTHER): Payer: Self-pay

## 2022-02-24 ENCOUNTER — Other Ambulatory Visit (HOSPITAL_BASED_OUTPATIENT_CLINIC_OR_DEPARTMENT_OTHER): Payer: Self-pay

## 2022-04-01 ENCOUNTER — Other Ambulatory Visit (HOSPITAL_COMMUNITY): Payer: Self-pay

## 2022-12-17 IMAGING — DX DG CHEST 1V PORT
1 series · 1 of 1 positions shown · non-contrast
Comparison: 07/08/2020

CLINICAL DATA: Cough

EXAM:
PORTABLE CHEST 1 VIEW

[chest ap]
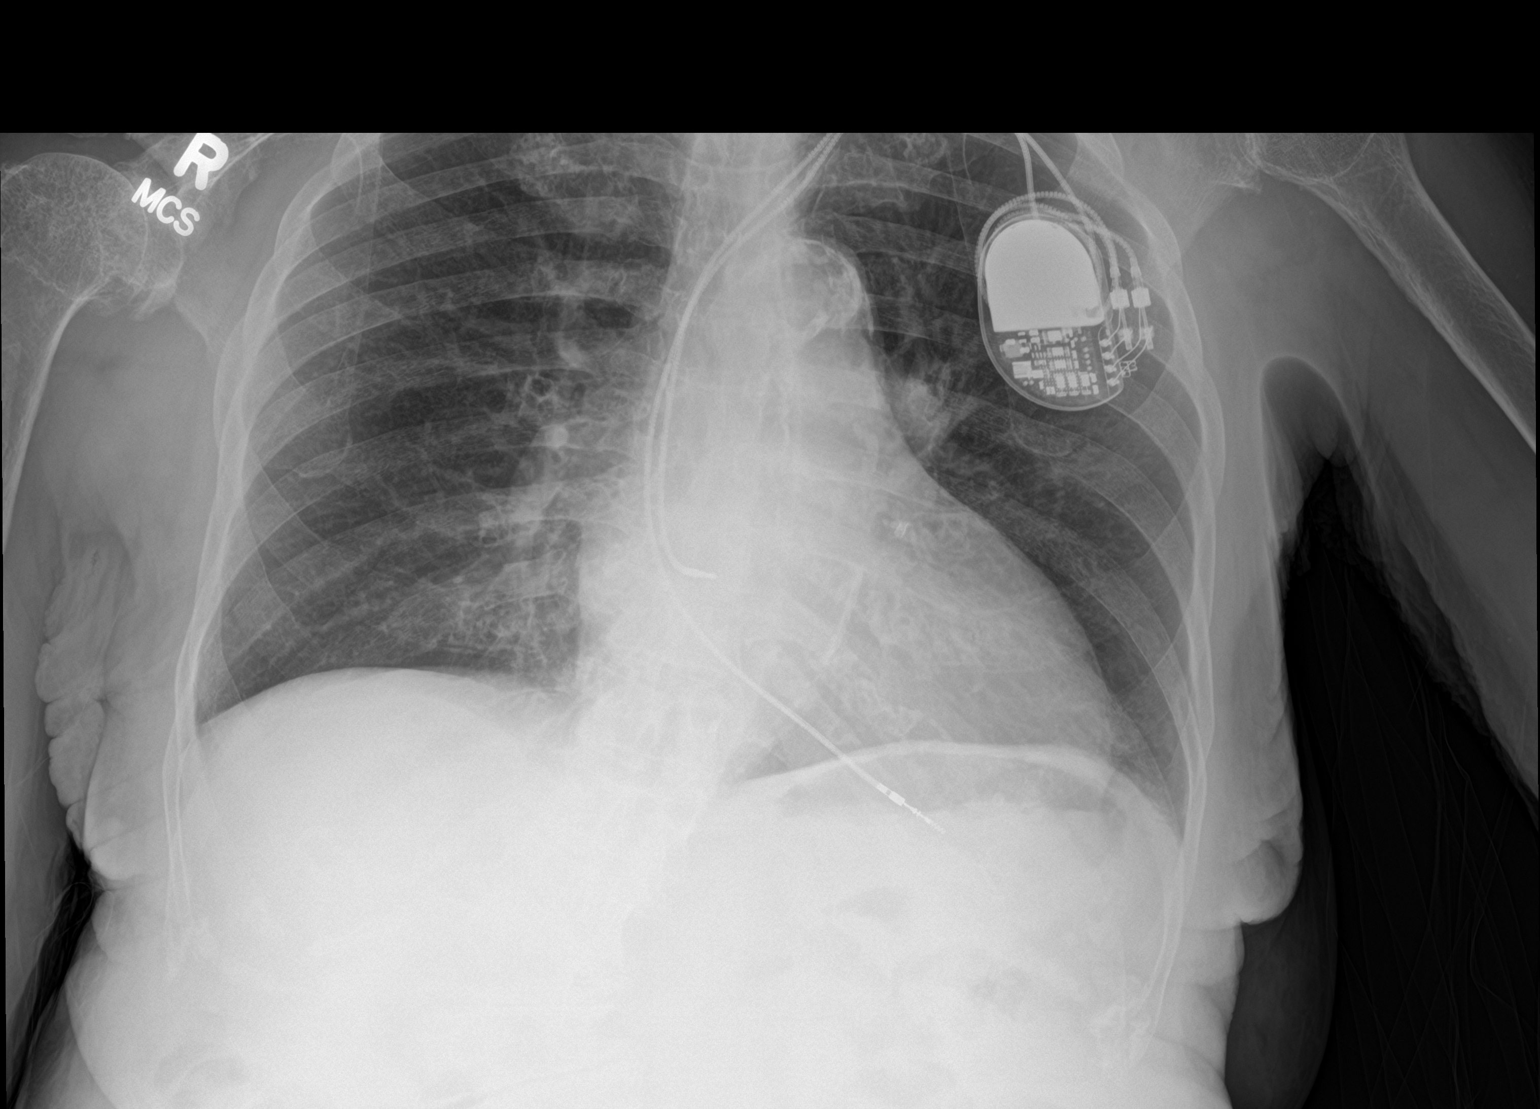

[1 of 1 positions shown; findings below may reference images not displayed]

FINDINGS: Left pacer remains in place, unchanged. Heart is normal size. Right
infrahilar and left suprahilar airspace opacities could reflect
pneumonia. No effusions or acute bony abnormality.
IMPRESSION: Patchy right infrahilar and left suprahilar opacities concerning for
pneumonia.

## 2024-04-11 IMAGING — DX DG CHEST 1V PORT
1 series · 1 of 1 positions shown · non-contrast
Comparison: 11/06/2021

CLINICAL DATA: Chest pain

EXAM:
PORTABLE CHEST 1 VIEW

[chest ap]
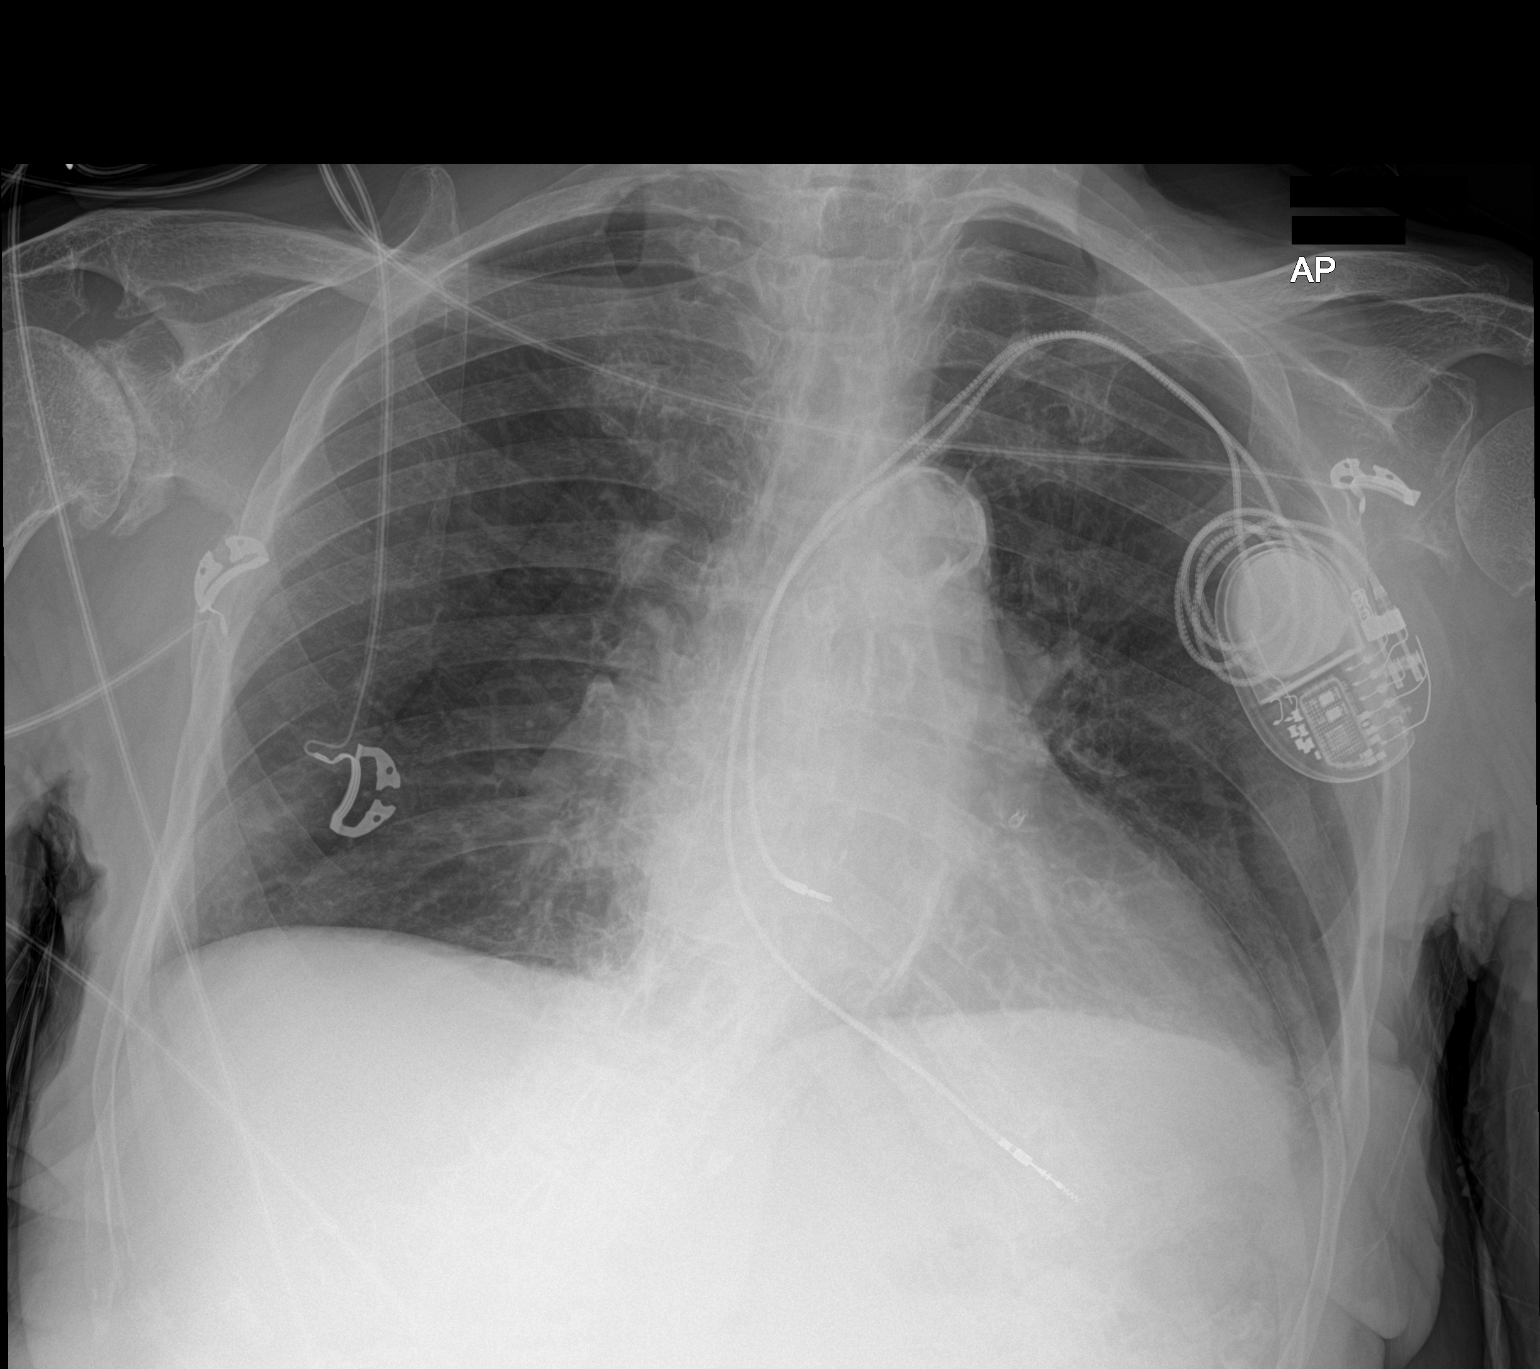

[1 of 1 positions shown; findings below may reference images not displayed]

FINDINGS: Left pacer remains in place, unchanged. Heart is normal size. Aortic
atherosclerosis. No confluent opacities or effusions. No acute bony
abnormality.
IMPRESSION: No active disease.
